# Patient Record
Sex: Female | Born: 1955 | Hispanic: Yes | Marital: Single | Smoking: Never smoker
Health system: Southern US, Community
[De-identification: ages and names within clinical notes are randomized; demographics above are authoritative.]

## PROBLEM LIST (undated history)

## (undated) DIAGNOSIS — E119 Type 2 diabetes mellitus without complications: Secondary | ICD-10-CM

## (undated) DIAGNOSIS — I10 Essential (primary) hypertension: Secondary | ICD-10-CM

## (undated) DIAGNOSIS — J45909 Unspecified asthma, uncomplicated: Secondary | ICD-10-CM

## (undated) HISTORY — PX: NO PAST SURGERIES: SHX2092

---

## 2015-11-21 ENCOUNTER — Encounter: Payer: Self-pay | Admitting: Emergency Medicine

## 2015-11-21 ENCOUNTER — Emergency Department: Payer: Medicare HMO

## 2015-11-21 ENCOUNTER — Emergency Department
Admission: EM | Admit: 2015-11-21 | Discharge: 2015-11-21 | Disposition: A | Discharge status: Home / Self Care | Payer: Medicare HMO | Attending: Student in an Organized Health Care Education/Training Program | Admitting: Student in an Organized Health Care Education/Training Program

## 2015-11-21 DIAGNOSIS — R0602 Shortness of breath: Secondary | ICD-10-CM | POA: Diagnosis present

## 2015-11-21 DIAGNOSIS — I10 Essential (primary) hypertension: Secondary | ICD-10-CM | POA: Insufficient documentation

## 2015-11-21 DIAGNOSIS — R079 Chest pain, unspecified: Secondary | ICD-10-CM

## 2015-11-21 DIAGNOSIS — J209 Acute bronchitis, unspecified: Secondary | ICD-10-CM | POA: Diagnosis not present

## 2015-11-21 DIAGNOSIS — E119 Type 2 diabetes mellitus without complications: Secondary | ICD-10-CM | POA: Insufficient documentation

## 2015-11-21 HISTORY — DX: Essential (primary) hypertension: I10

## 2015-11-21 HISTORY — DX: Type 2 diabetes mellitus without complications: E11.9

## 2015-11-21 LAB — BASIC METABOLIC PANEL
ANION GAP: 8 (ref 5–15)
BUN: 17 mg/dL (ref 6–20)
CALCIUM: 8.9 mg/dL (ref 8.9–10.3)
CO2: 28 mmol/L (ref 22–32)
CREATININE: 0.59 mg/dL (ref 0.44–1.00)
Chloride: 103 mmol/L (ref 101–111)
GFR calc Af Amer: >60 mL/min (ref >60)
GLUCOSE: 215 mg/dL — AB (ref 65–99)
Potassium: 4 mmol/L (ref 3.5–5.1)
Sodium: 139 mmol/L (ref 135–145)

## 2015-11-21 LAB — FIBRIN DERIVATIVES D-DIMER (ARMC ONLY): FIBRIN DERIVATIVES D-DIMER (ARMC): 565 — AB (ref 0–499)

## 2015-11-21 LAB — CBC
HCT: 39.2 % (ref 35.0–47.0)
HEMOGLOBIN: 13.6 g/dL (ref 12.0–16.0)
MCH: 30.8 pg (ref 26.0–34.0)
MCHC: 34.6 g/dL (ref 32.0–36.0)
MCV: 89 fL (ref 80.0–100.0)
PLATELETS: 159 10*3/uL (ref 150–440)
RBC: 4.4 MIL/uL (ref 3.80–5.20)
RDW: 13.3 % (ref 11.5–14.5)
WBC: 6.9 10*3/uL (ref 3.6–11.0)

## 2015-11-21 LAB — TROPONIN I: Troponin I: <0.03 ng/mL (ref <0.03)

## 2015-11-21 LAB — BRAIN NATRIURETIC PEPTIDE: B Natriuretic Peptide: 91 pg/mL (ref 0.0–100.0)

## 2015-11-21 MED ORDER — PREDNISONE 20 MG PO TABS: 60.0000 mg | ORAL_TABLET | Freq: Every day | ORAL | 0 refills | Status: AC

## 2015-11-21 MED ORDER — IOPAMIDOL (ISOVUE-370) INJECTION 76%
75.0000 mL | Freq: Once | INTRAVENOUS | Status: AC | PRN
  Administered 2015-11-21: 75 mL via INTRAVENOUS

## 2015-11-21 MED ORDER — ALBUTEROL SULFATE HFA 108 (90 BASE) MCG/ACT IN AERS: 2.0000 | INHALATION_SPRAY | Freq: Four times a day (QID) | RESPIRATORY_TRACT | 2 refills | Status: DC | PRN

## 2015-11-21 MED ORDER — IPRATROPIUM-ALBUTEROL 0.5-2.5 (3) MG/3ML IN SOLN
3.0000 mL | Freq: Once | RESPIRATORY_TRACT | Status: AC
  Administered 2015-11-21: 3 mL via RESPIRATORY_TRACT
  Filled 2015-11-21: qty 3

## 2015-11-21 MED ORDER — ALBUTEROL SULFATE (2.5 MG/3ML) 0.083% IN NEBU
5.0000 mg | INHALATION_SOLUTION | Freq: Once | RESPIRATORY_TRACT | Status: AC
  Administered 2015-11-21: 5 mg via RESPIRATORY_TRACT
  Filled 2015-11-21: qty 6

## 2015-11-21 MED ORDER — AZITHROMYCIN 500 MG PO TABS
500.0000 mg | ORAL_TABLET | Freq: Once | ORAL | Status: AC
  Administered 2015-11-21: 500 mg via ORAL
  Filled 2015-11-21: qty 1

## 2015-11-21 MED ORDER — AZITHROMYCIN 250 MG PO TABS: ORAL_TABLET | ORAL | 0 refills | Status: DC

## 2015-11-21 MED ORDER — PREDNISONE 20 MG PO TABS
60.0000 mg | ORAL_TABLET | Freq: Once | ORAL | Status: AC
  Administered 2015-11-21: 60 mg via ORAL
  Filled 2015-11-21: qty 3

## 2015-11-21 MED ORDER — LORAZEPAM 1 MG PO TABS
1.0000 mg | ORAL_TABLET | Freq: Once | ORAL | Status: AC
  Administered 2015-11-21: 1 mg via ORAL
  Filled 2015-11-21: qty 1

## 2015-11-21 NOTE — ED Provider Notes (Signed)
[facility] Emergency Department Provider Note    None    (approximate)  I have reviewed the triage vital signs and the nursing notes.   HISTORY  Chief Complaint Chest Pain and Shortness of Breath    HPI Rhonda Wright is a 60 y.o. female  history of hypertension and diabetes presents with 2 weeks of present worsening shortness of breath. Patient states she is also having intermittent episodes of chest discomfort. States that she's got a history of chronic orthopnea and was intimately have lower extremity swelling. States that she was going to let her grandchild today and had sudden onset shortness of breath and chest discomfort. She denies any active chest pain right now. There is no diaphoresis or radiation of pain to the back or to the shoulders. She denies any fevers. Denies any productive cough.  Denies any previous history of coronary disease. States that while sitting out in the lobby she smelled some perfume which made her shortness of breath acutely worse. Denies any personal history of asthma or COPD. She is a nonsmoker.   Past Medical History:  Diagnosis Date  . Diabetes mellitus without complication (HCC)   . Hypertension     There are no active problems to display for this patient.   PMH:  No known history of asthma or copd  Prior to Admission medications   Medication Sig Start Date End Date Taking? Authorizing Provider  albuterol (PROVENTIL HFA;VENTOLIN HFA) 108 (90 Base) MCG/ACT inhaler Inhale 2 puffs into the lungs every 6 (six) hours as needed for wheezing or shortness of breath. 11/21/15   Willy EddyPatrick Lastacia Solum, MD  azithromycin (ZITHROMAX) 250 MG tablet Take one tab daily until completed. 11/21/15   Willy EddyPatrick Ellesse Antenucci, MD  predniSONE (DELTASONE) 20 MG tablet Take 3 tablets (60 mg total) by mouth daily. 11/21/15 11/25/15  Willy EddyPatrick Salim Forero, MD    Allergies Amoxicillin  No family history on file.  Social History Social History  Substance  Use Topics  . Smoking status: Never Smoker  . Smokeless tobacco: Never Used  . Alcohol use No    Review of Systems Patient denies headaches, rhinorrhea, blurry vision, numbness, shortness of breath, chest pain, edema, cough, abdominal pain, nausea, vomiting, diarrhea, dysuria, fevers, rashes or hallucinations unless otherwise stated above in HPI. ____________________________________________   PHYSICAL EXAM:  VITAL SIGNS: Vitals:   11/21/15 1022  BP: (!) 181/89  Pulse: 67  Resp: 20  Temp: 98 F (36.7 C)    Constitutional: Alert and oriented. Well appearing and in no acute distress. Eyes: Conjunctivae are normal. PERRL. EOMI. Head: Atraumatic. Nose: No congestion/rhinnorhea. Mouth/Throat: Mucous membranes are moist.  Oropharynx non-erythematous. Neck: No stridor. Painless ROM. No cervical spine tenderness to palpation Hematological/Lymphatic/Immunilogical: No cervical lymphadenopathy. Cardiovascular: Normal rate, regular rhythm. Grossly normal heart sounds.  Good peripheral circulation. Respiratory: Normal respiratory effort.  No retractions. Lungs with diminshed breathsounds bilaterally, no crackles, no respiratory distress Gastrointestinal: Soft and nontender. No distention. No abdominal bruits. No CVA tenderness. Genitourinary:  Musculoskeletal: No lower extremity tenderness nor edema.  No joint effusions. Neurologic:  Normal speech and language. No gross focal neurologic deficits are appreciated. No gait instability. Skin:  Skin is warm, dry and intact. No rash noted. Psychiatric: Mood and affect are normal. Speech and behavior are normal.  ____________________________________________   LABS (all labs ordered are listed, but only abnormal results are displayed)  Results for orders placed or performed during the hospital encounter of 11/21/15 (from the past 24 hour(s))  Basic metabolic  panel     Status: Abnormal   Collection Time: 11/21/15 10:26 AM  Result Value Ref  Range   Sodium 139 135 - 145 mmol/L   Potassium 4.0 3.5 - 5.1 mmol/L   Chloride 103 101 - 111 mmol/L   CO2 28 22 - 32 mmol/L   Glucose, Bld 215 (H) 65 - 99 mg/dL   BUN 17 6 - 20 mg/dL   Creatinine, Ser 1.61 0.44 - 1.00 mg/dL   Calcium 8.9 8.9 - 09.6 mg/dL   GFR calc non Af Amer >60 >60 mL/min   GFR calc Af Amer >60 >60 mL/min   Anion gap 8 5 - 15  CBC     Status: None   Collection Time: 11/21/15 10:26 AM  Result Value Ref Range   WBC 6.9 3.6 - 11.0 K/uL   RBC 4.40 3.80 - 5.20 MIL/uL   Hemoglobin 13.6 12.0 - 16.0 g/dL   HCT 04.5 40.9 - 81.1 %   MCV 89.0 80.0 - 100.0 fL   MCH 30.8 26.0 - 34.0 pg   MCHC 34.6 32.0 - 36.0 g/dL   RDW 91.4 78.2 - 95.6 %   Platelets 159 150 - 440 K/uL  Troponin I     Status: None   Collection Time: 11/21/15 10:26 AM  Result Value Ref Range   Troponin I <0.03 <0.03 ng/mL  Fibrin derivatives D-Dimer ([facility] only)     Status: Abnormal   Collection Time: 11/21/15 10:26 AM  Result Value Ref Range   Fibrin derivatives D-dimer ([facility]) 565 (H) 0 - 499  Brain natriuretic peptide     Status: None   Collection Time: 11/21/15 10:26 AM  Result Value Ref Range   B Natriuretic Peptide 91.0 0.0 - 100.0 pg/mL   ____________________________________________  EKG My review and personal interpretation at Time: 10:15   Indication: sob  Rate: 65  Rhythm: sinus Axis: normal Other: non spcific st changes, no acute ischemia ____________________________________________  RADIOLOGY  I personally reviewed all radiographic images ordered to evaluate for the above acute complaints and reviewed radiology reports and findings.  These findings were personally discussed with the patient.  Please see medical record for radiology report.  ____________________________________________   PROCEDURES  Procedure(s) performed: none    Critical Care performed: no ____________________________________________   INITIAL IMPRESSION / ASSESSMENT AND PLAN / ED COURSE  Pertinent  labs & imaging results that were available during my care of the patient were reviewed by me and considered in my medical decision making (see chart for details).  DDX: Asthma, copd, CHF, pna, ptx, malignancy, Pe, anemia  Ily Sammons is a 60 y.o. who presents to the ED with 2 weeks of worsening shortness of breath and intermittent chest pain. EKG shows no evidence of acute ischemia. She has no hypoxia. No previous history of COPD. Her risk factors for CAD include hypertension and diabetes. She has no known coronary artery disease. Description of ACS is fairly atypical described very brief episodes of chest pain lasting less than 2-3 minutes. It seems that shortness of breath is her primary concern. Neck likely consistent with CHF. Patient is low-risk [place] therefore we'll further risk stratify for PE with a d-dimer. With this complaint of shortness of breath and diminished breath sounds well also give trial of albuterol nebulizers to see if she has any improvement in his underlying component of bronchitis or obstructive pathology.  The patient will be placed on continuous pulse oximetry and telemetry for monitoring.  Laboratory evaluation will be  sent to evaluate for the above complaints.     Clinical Course  Comment By Time  Patient reports significant improvement and discomfort after DuoNeb. On repeat lung exam patient does have improved movement and finished that were wheezes that were not fully appreciated on previous exam. CT imaging is unremarkable. Patient denies any chest pain or pressure at this time. Currently awaiting on repeat troponin to further risk stratify. clinically this is less consistent with ACS based on history and EKG and troponin without any evidence of acute ischemia with symptoms ongoing for over 2 weeks. Willy Eddy, MD 10/27 [phone]  Patient reassessed remains he medically stable. Now with [place] if a more significant improvement in breath sounds. Patient still feels much  better. Evette Cristal describes issues with getting establish primary care due to frequent travel [place] and [place].  Have provided multiple resources and referrals to primary care service in both cities.  The patient was able to ambulate with a steady gait and with no hypoxia or respiratory distress. Patient remains without acute hypoxia and as her symptoms have remarkably improved without any persistent chest pain or shortness of breath I do feel that she's currently stable for outpatient management. Patient is able to obtain medications on her own and demonstrates ability and understanding of signs and symptoms for which she should return to the ER. Willy Eddy, MD 10/27 1548     ____________________________________________   FINAL CLINICAL IMPRESSION(S) / ED DIAGNOSES  Final diagnoses:  Acute bronchitis, unspecified organism  Chest pain, unspecified type      NEW MEDICATIONS STARTED DURING THIS VISIT:  New Prescriptions   ALBUTEROL (PROVENTIL HFA;VENTOLIN HFA) 108 (90 BASE) MCG/ACT INHALER    Inhale 2 puffs into the lungs every 6 (six) hours as needed for wheezing or shortness of breath.   AZITHROMYCIN (ZITHROMAX) 250 MG TABLET    Take one tab daily until completed.   PREDNISONE (DELTASONE) 20 MG TABLET    Take 3 tablets (60 mg total) by mouth daily.     Note:  This document was prepared using Dragon voice recognition software and may include unintentional dictation errors.    Willy Eddy, MD 11/21/15 [phone]

## 2015-11-21 NOTE — ED Triage Notes (Signed)
Pt presents with chest pain and shortness of breath for over a week.

## 2015-11-21 NOTE — ED Notes (Signed)
This RN ambulated pt, pt maintained adequate oxygen sats, reports some increased work of breathing

## 2016-02-09 ENCOUNTER — Emergency Department: Payer: Medicare HMO

## 2016-02-09 ENCOUNTER — Inpatient Hospital Stay
Admission: EM | Admit: 2016-02-09 | Discharge: 2016-02-12 | DRG: 203 | Disposition: A | Discharge status: Home / Self Care | Payer: Medicare HMO | Attending: Internal Medicine | Admitting: Internal Medicine

## 2016-02-09 DIAGNOSIS — J45901 Unspecified asthma with (acute) exacerbation: Principal | ICD-10-CM | POA: Diagnosis present

## 2016-02-09 DIAGNOSIS — J09X2 Influenza due to identified novel influenza A virus with other respiratory manifestations: Secondary | ICD-10-CM | POA: Diagnosis present

## 2016-02-09 DIAGNOSIS — R0602 Shortness of breath: Secondary | ICD-10-CM | POA: Diagnosis not present

## 2016-02-09 DIAGNOSIS — Z794 Long term (current) use of insulin: Secondary | ICD-10-CM

## 2016-02-09 DIAGNOSIS — R262 Difficulty in walking, not elsewhere classified: Secondary | ICD-10-CM

## 2016-02-09 DIAGNOSIS — Z833 Family history of diabetes mellitus: Secondary | ICD-10-CM | POA: Diagnosis not present

## 2016-02-09 DIAGNOSIS — R0902 Hypoxemia: Secondary | ICD-10-CM | POA: Diagnosis present

## 2016-02-09 DIAGNOSIS — Z23 Encounter for immunization: Secondary | ICD-10-CM

## 2016-02-09 DIAGNOSIS — J111 Influenza due to unidentified influenza virus with other respiratory manifestations: Secondary | ICD-10-CM | POA: Diagnosis present

## 2016-02-09 DIAGNOSIS — Z880 Allergy status to penicillin: Secondary | ICD-10-CM

## 2016-02-09 DIAGNOSIS — I10 Essential (primary) hypertension: Secondary | ICD-10-CM | POA: Diagnosis present

## 2016-02-09 DIAGNOSIS — Z8249 Family history of ischemic heart disease and other diseases of the circulatory system: Secondary | ICD-10-CM | POA: Diagnosis not present

## 2016-02-09 DIAGNOSIS — E119 Type 2 diabetes mellitus without complications: Secondary | ICD-10-CM | POA: Diagnosis present

## 2016-02-09 DIAGNOSIS — E118 Type 2 diabetes mellitus with unspecified complications: Secondary | ICD-10-CM

## 2016-02-09 DIAGNOSIS — J101 Influenza due to other identified influenza virus with other respiratory manifestations: Secondary | ICD-10-CM

## 2016-02-09 HISTORY — DX: Unspecified asthma, uncomplicated: J45.909

## 2016-02-09 LAB — CBC
HEMATOCRIT: 40 % (ref 35.0–47.0)
HEMOGLOBIN: 13.9 g/dL (ref 12.0–16.0)
MCH: 30.8 pg (ref 26.0–34.0)
MCHC: 34.7 g/dL (ref 32.0–36.0)
MCV: 88.8 fL (ref 80.0–100.0)
Platelets: 153 10*3/uL (ref 150–440)
RBC: 4.51 MIL/uL (ref 3.80–5.20)
RDW: 13.4 % (ref 11.5–14.5)
WBC: 8.9 10*3/uL (ref 3.6–11.0)

## 2016-02-09 LAB — INFLUENZA PANEL BY PCR (TYPE A & B)
INFLAPCR: POSITIVE — AB
Influenza B By PCR: NEGATIVE

## 2016-02-09 LAB — BASIC METABOLIC PANEL
ANION GAP: 9 (ref 5–15)
BUN: 14 mg/dL (ref 6–20)
CO2: 27 mmol/L (ref 22–32)
Calcium: 8.9 mg/dL (ref 8.9–10.3)
Chloride: 95 mmol/L — ABNORMAL LOW (ref 101–111)
Creatinine, Ser: 0.6 mg/dL (ref 0.44–1.00)
GFR calc Af Amer: >60 mL/min (ref >60)
GFR calc non Af Amer: >60 mL/min (ref >60)
GLUCOSE: 242 mg/dL — AB (ref 65–99)
POTASSIUM: 3.9 mmol/L (ref 3.5–5.1)
Sodium: 131 mmol/L — ABNORMAL LOW (ref 135–145)

## 2016-02-09 LAB — TROPONIN I: Troponin I: 0.03 ng/mL (ref <0.03)

## 2016-02-09 MED ORDER — IPRATROPIUM-ALBUTEROL 0.5-2.5 (3) MG/3ML IN SOLN
6.0000 mL | Freq: Four times a day (QID) | RESPIRATORY_TRACT | Status: DC
  Administered 2016-02-09: 6 mL via RESPIRATORY_TRACT

## 2016-02-09 MED ORDER — HYDRALAZINE HCL 20 MG/ML IJ SOLN
INTRAMUSCULAR | Status: AC
  Filled 2016-02-09: qty 1

## 2016-02-09 MED ORDER — IPRATROPIUM-ALBUTEROL 0.5-2.5 (3) MG/3ML IN SOLN
RESPIRATORY_TRACT | Status: AC
  Administered 2016-02-09: 6 mL via RESPIRATORY_TRACT
  Filled 2016-02-09: qty 6

## 2016-02-09 MED ORDER — OSELTAMIVIR PHOSPHATE 75 MG PO CAPS
75.0000 mg | ORAL_CAPSULE | Freq: Once | ORAL | Status: AC
  Administered 2016-02-09: 75 mg via ORAL
  Filled 2016-02-09: qty 1

## 2016-02-09 MED ORDER — HYDRALAZINE HCL 20 MG/ML IJ SOLN
10.0000 mg | Freq: Once | INTRAMUSCULAR | Status: AC
  Administered 2016-02-09: 10 mg via INTRAVENOUS

## 2016-02-09 NOTE — ED Notes (Signed)
Pt placed on 2L O2 via [place] d/t Triage oxygen sats in 87-90% range on RA. Pt sats improved to 94%; pt placed in sub-wait at this time.

## 2016-02-09 NOTE — ED Notes (Signed)
Melody Lawerance BachBurns- daughter in [phone]

## 2016-02-09 NOTE — ED Notes (Signed)
Pt states medial CP, non radiating off and off x 2-3 days with SOB as well. States she was told she has asthma, was given inhaler and states it hasn't been working today. Pt on 2 L nasal cannula from triage, does not wear oxygen normally. States diarrhea and fever at home (does not have thermometer to take temperature at home). Pt sitting up, alert and oriented.

## 2016-02-09 NOTE — ED Notes (Signed)
Dr. Lenard LancePaduchowski turned pt oxygen off. Pt 93% RA but stating she was having difficulty breathing. Oxygen turned back on by this RN.

## 2016-02-09 NOTE — ED Provider Notes (Signed)
[facility] Emergency Department Provider Note  Time seen: 9:05 PM  I have reviewed the triage vital signs and the nursing notes.   HISTORY  Chief Complaint Shortness of Breath and Chest Pain    HPI Rhonda Wright is a 61 y.o. female with a past medical history of diabetes and hypertension who presents to the emergency department with 2-3 days of shortness of breath generalized weakness fatigue and fever.Patient has a room air saturation of 86% with a fever of 100.5 and a pulse rate of 106. Patient denies any chest pain or abdominal pain but does state nausea with episode of diarrhea today. Has not vomited. States she feels extremely fatigued and weak. Sitting upright in bed with mild distress due to difficulty breathing.  Past Medical History:  Diagnosis Date  . Diabetes mellitus without complication (HCC)   . Hypertension     There are no active problems to display for this patient.   History reviewed. No pertinent surgical history.  Prior to Admission medications   Medication Sig Start Date End Date Taking? Authorizing Provider  albuterol (PROVENTIL HFA;VENTOLIN HFA) 108 (90 Base) MCG/ACT inhaler Inhale 2 puffs into the lungs every 6 (six) hours as needed for wheezing or shortness of breath. 11/21/15   Willy EddyPatrick Robinson, MD  azithromycin (ZITHROMAX) 250 MG tablet Take one tab daily until completed. 11/21/15   Willy EddyPatrick Robinson, MD    Allergies  Allergen Reactions  . Amoxicillin     No family history on file.  Social History Social History  Substance Use Topics  . Smoking status: Never Smoker  . Smokeless tobacco: Never Used  . Alcohol use No    Review of Systems Constitutional: Subjective fever. Cardiovascular: Negative for chest pain. Respiratory: Positive for shortness of breath and positive for cough. Gastrointestinal: Negative for abdominal pain. Positive for nausea. Negative for vomiting. Positive for diarrhea. Genitourinary: Negative  for dysuria. Musculoskeletal: Negative for back pain. Neurological: Negative for headache 10-point ROS otherwise negative.  ____________________________________________   PHYSICAL EXAM:  VITAL SIGNS: ED Triage Vitals  Enc Vitals Group     BP 02/09/16 1931 (!) 171/86     Pulse Rate 02/09/16 1931 (!) 106     Resp 02/09/16 1931 20     Temp 02/09/16 1931 (!) 100.5 F (38.1 C)     Temp Source 02/09/16 1931 Oral     SpO2 02/09/16 1931 (!) 86 %     Weight 02/09/16 1931 300 lb (136.1 kg)     Height 02/09/16 1931 (!) 5" (0.127 m)     Head Circumference --      Peak Flow --      Pain Score 02/09/16 1932 0     Pain Loc --      Pain Edu? --      Excl. in GC? --     Constitutional: Alert and oriented. Sitting upright in bed, mild respiratory distress. Eyes: Normal exam ENT   Head: Normocephalic and atraumatic.   Mouth/Throat: Mucous membranes are moist. Cardiovascular: Normal rate, regular rhythm around 100 bpm. No murmur Respiratory: Normal respiratory effort without tachypnea nor retractions. Breath sounds are clear  Gastrointestinal: Soft and nontender. No distention.   Musculoskeletal: Nontender with normal range of motion in all extremities.  Neurologic:  Normal speech and language. No gross focal neurologic deficits  Skin:  Skin is warm, dry and intact.  Psychiatric: Mood and affect are normal.  ____________________________________________    EKG  EKG reviewed and interpreted by myself shows  normal sinus rhythm at 96 bpm. Narrow QRS, slight left axis deviation, large the normal intervals with no concerning ST changes.  ____________________________________________    RADIOLOGY  Chest x-ray shows prominent vascular markings and mild cardiomegaly and/or reactive airway disease.  ____________________________________________   INITIAL IMPRESSION / ASSESSMENT AND PLAN / ED COURSE  Pertinent labs & imaging results that were available during my care of the patient  were reviewed by me and considered in my medical decision making (see chart for details).  Patient presents the emergency department with difficulty breathing generalized weakness fatigue and fever. Patient's labs have resulted positive for influenza A. Patient is hypoxic to 86% on room air. Denies any history of asthma COPD or CHF. Chest x-ray shows vascular markings which could be consistent pulmonary edema reactive airway disease. We will treat with DuoNeb's and continue to closely monitor in the emergency department.  Patient remains hypoxic off oxygen. Remains have shortness of breath. We'll admit the patient to the hospital. Start Tamiflu. ____________________________________________   FINAL CLINICAL IMPRESSION(S) / ED DIAGNOSES  Dyspnea Hypoxia Influenza A    Minna Antis, MD 02/09/16 2208

## 2016-02-09 NOTE — ED Notes (Signed)
Dr. Willis at bedside  

## 2016-02-09 NOTE — H&P (Signed)
[facility] - [place] at [facility]   PATIENT NAME: Rhonda Wright    MR#:  [id number]  DATE OF BIRTH:  09-11-1955  DATE OF ADMISSION:  02/09/2016  PRIMARY CARE PHYSICIAN: No PCP Per Patient   REQUESTING/REFERRING PHYSICIAN: Lenard Lance, MD  CHIEF COMPLAINT:   Chief Complaint  Patient presents with  . Shortness of Breath  . Chest Pain    HISTORY OF PRESENT ILLNESS:  Rhonda Wright  is a 61 y.o. female who presents with Increased shortness of breath, malaise, fever for the past 2 days. Patient states she was diagnosed not very long ago with asthma, and has had difficulty breathing since that time. However over the past 2 days this grew significantly worse, and she developed fevers with increased cough and fatigue. Here in the ED she was found to have O2 sats in the mid 80s on room air, which corrected well with oxygen via nasal cannula. She was found to be fluid positive. Chest x-ray did not seem to indicate pneumonia and a right blood cell count was within normal limits, as well as the rest of her labs. Tamiflu was started. Given her hypoxia in the setting of confirm influenza, hospitalists were called for admission  PAST MEDICAL HISTORY:   Past Medical History:  Diagnosis Date  . Diabetes mellitus without complication (HCC)   . Hypertension     PAST SURGICAL HISTORY:   Past Surgical History:  Procedure Laterality Date  . NO PAST SURGERIES      SOCIAL HISTORY:   Social History  Substance Use Topics  . Smoking status: Never Smoker  . Smokeless tobacco: Never Used  . Alcohol use No    FAMILY HISTORY:   Family History  Problem Relation Age of Onset  . Diabetes Other   . Hypertension Other   . Cancer Other     DRUG ALLERGIES:   Allergies  Allergen Reactions  . Amoxicillin Swelling and Other (See Comments)    Has patient had a PCN reaction causing immediate rash, facial/tongue/throat swelling, SOB or lightheadedness with hypotension:  Yes Has patient had a PCN reaction causing severe rash involving mucus membranes or skin necrosis: No Has patient had a PCN reaction that required hospitalization No Has patient had a PCN reaction occurring within the last 10 years: Yes If all of the above answers are "NO", then may proceed with Cephalosporin use.     MEDICATIONS AT HOME:   Prior to Admission medications   Medication Sig Start Date End Date Taking? Authorizing Provider  albuterol (PROVENTIL HFA;VENTOLIN HFA) 108 (90 Base) MCG/ACT inhaler Inhale 2 puffs into the lungs every 6 (six) hours as needed for wheezing or shortness of breath. 11/21/15  Yes Willy Eddy, MD  albuterol (PROVENTIL) (2.5 MG/3ML) 0.083% nebulizer solution Take 2.5 mg by nebulization every 6 (six) hours as needed for wheezing or shortness of breath.   Yes Historical Provider, MD  ibuprofen (ADVIL,MOTRIN) 200 MG tablet Take 1,000-1,200 mg by mouth every 6 (six) hours as needed for fever or mild pain.   Yes Historical Provider, MD    REVIEW OF SYSTEMS:  Review of Systems  Constitutional: Positive for fever and malaise/fatigue. Negative for chills and weight loss.  HENT: Negative for ear pain, hearing loss and tinnitus.   Eyes: Negative for blurred vision, double vision, pain and redness.  Respiratory: Positive for cough and shortness of breath. Negative for hemoptysis.   Cardiovascular: Negative for chest pain, palpitations, orthopnea and leg swelling.  Gastrointestinal: Negative for  abdominal pain, constipation, diarrhea, nausea and vomiting.  Genitourinary: Negative for dysuria, frequency and hematuria.  Musculoskeletal: Negative for back pain, joint pain and neck pain.  Skin:       No acne, rash, or lesions  Neurological: Negative for dizziness, tremors, focal weakness and weakness.  Endo/Heme/Allergies: Negative for polydipsia. Does not bruise/bleed easily.  Psychiatric/Behavioral: Negative for depression. The patient is not nervous/anxious and  does not have insomnia.      VITAL SIGNS:   Vitals:   02/09/16 2130 02/09/16 2140 02/09/16 2150 02/09/16 2200  BP: (!) 209/99 (!) 181/91 (!) 160/92 (!) 171/95  Pulse:  91 90 86  Resp: (!) 25 (!) 30 (!) 31 (!) 28  Temp:      TempSrc:      SpO2:  93% 92% 95%  Weight:      Height:       Wt Readings from Last 3 Encounters:  02/09/16 136.1 kg (300 lb)  11/21/15 111.1 kg (245 lb)    PHYSICAL EXAMINATION:  Physical Exam  Vitals reviewed. Constitutional: She is oriented to person, place, and time. She appears well-developed and well-nourished. No distress.  HENT:  Head: Normocephalic and atraumatic.  Mouth/Throat: Oropharynx is clear and moist.  Eyes: Conjunctivae and EOM are normal. Pupils are equal, round, and reactive to light. No scleral icterus.  Neck: Normal range of motion. Neck supple. No JVD present. No thyromegaly present.  Cardiovascular: Normal rate, regular rhythm and intact distal pulses.  Exam reveals no gallop and no friction rub.   No murmur heard. Respiratory: She is in respiratory distress. She has no wheezes. She has no rales.  Diffuse bilateral coarse breath sounds, worst on expiration  GI: Soft. Bowel sounds are normal. She exhibits no distension. There is no tenderness.  Musculoskeletal: Normal range of motion. She exhibits no edema.  No arthritis, no gout  Lymphadenopathy:    She has no cervical adenopathy.  Neurological: She is alert and oriented to person, place, and time. No cranial nerve deficit.  No dysarthria, no aphasia  Skin: Skin is warm and dry. No rash noted. No erythema.  Psychiatric: She has a normal mood and affect. Her behavior is normal. Judgment and thought content normal.    LABORATORY PANEL:   CBC  Recent Labs Lab 02/09/16 1937  WBC 8.9  HGB 13.9  HCT 40.0  PLT 153   ------------------------------------------------------------------------------------------------------------------  Chemistries   Recent Labs Lab  02/09/16 1937  NA 131*  K 3.9  CL 95*  CO2 27  GLUCOSE 242*  BUN 14  CREATININE 0.60  CALCIUM 8.9   ------------------------------------------------------------------------------------------------------------------  Cardiac Enzymes  Recent Labs Lab 02/09/16 1937  TROPONINI 0.03*   ------------------------------------------------------------------------------------------------------------------  RADIOLOGY:  Dg Chest 2 View  Result Date: 02/09/2016 CLINICAL DATA:  Shortness of breath, sternal chest pressure. History of diabetes and hypertension. EXAM: CHEST  2 VIEW COMPARISON:  Chest radiograph September 21, 2015 FINDINGS: Cardiac silhouette is mildly enlarged. Prominent bronchovascular markings without pleural effusion. Bibasilar strandy densities. No pneumothorax. Soft tissue planes and included osseous structures are unchanged. Mild degenerative change of the thoracic spine. IMPRESSION: Mild cardiomegaly. Prominent bronchovascular markings can be seen with pulmonary edema and/or bronchitis/reactive airway disease. Bibasilar atelectasis. Electronically Signed   By: Awilda Metro M.D.   On: 02/09/2016 20:24    EKG:   Orders placed or performed during the hospital encounter of 02/09/16  . ED EKG within 10 minutes  . ED EKG within 10 minutes    IMPRESSION AND PLAN:  Principal Problem:   Hypoxia - this is due to her influenza and underlying asthma, see treatment for these below, oxygen level improved significantly with O2 via nasal cannula, we will continue supplemental oxygen for now Active Problems:   Influenza - Tamiflu started this patient's symptoms likely developed the last 48 hours, continue Tamiflu for full course   Asthma exacerbation - when necessary duo nebs, antitussives, we will also give her a prednisone burst   HTN (hypertension) - stable, continue home meds   Diabetes (HCC) - sliding scale insulin with corresponding glucose checks  All the records are reviewed  and case discussed with ED provider. Management plans discussed with the patient and/or family.  DVT PROPHYLAXIS: SubQ lovenox  GI PROPHYLAXIS: None  ADMISSION STATUS: Inpatient  CODE STATUS: Full Code Status History    This patient does not have a recorded code status. Please follow your organizational policy for patients in this situation.      TOTAL TIME TAKING CARE OF THIS PATIENT: 45 minutes.    Tresean Mattix FIELDING 02/09/2016, 10:39 PM  [facility]  Office  [phone]  CC: Primary care physician; No PCP Per Patient

## 2016-02-09 NOTE — ED Triage Notes (Addendum)
Pt presents to ED with c/o centralized, non-radiating chest pain and shortness of breath x2 days. Pt reports h/x of asthma, denies cardiac history. Pt reports dyspnea at rest, with congested non-productive cough. Pt reports (+) fever (didn't take actual temp "I just feel the hotness"), (+) nausea (-) emesis (+) diarrhea. Pt reports taking Mucinex last night but nothing today. Pt reports using Nebulizer at home without relief.

## 2016-02-09 NOTE — ED Notes (Signed)
Pt states hx HTN, does not take any medicine for BP and denies taking anything in a long time for BP.

## 2016-02-10 LAB — GLUCOSE, CAPILLARY
GLUCOSE-CAPILLARY: 256 mg/dL — AB (ref 65–99)
GLUCOSE-CAPILLARY: 194 mg/dL — AB (ref 65–99)
Glucose-Capillary: 333 mg/dL — ABNORMAL HIGH (ref 65–99)
Glucose-Capillary: 223 mg/dL — ABNORMAL HIGH (ref 65–99)

## 2016-02-10 LAB — BASIC METABOLIC PANEL
Anion gap: 8 (ref 5–15)
BUN: 14 mg/dL (ref 6–20)
CO2: 27 mmol/L (ref 22–32)
Calcium: 8.5 mg/dL — ABNORMAL LOW (ref 8.9–10.3)
Chloride: 99 mmol/L — ABNORMAL LOW (ref 101–111)
Creatinine, Ser: 0.69 mg/dL (ref 0.44–1.00)
Glucose, Bld: 324 mg/dL — ABNORMAL HIGH (ref 65–99)
POTASSIUM: 4.2 mmol/L (ref 3.5–5.1)
SODIUM: 134 mmol/L — AB (ref 135–145)

## 2016-02-10 LAB — CBC
HEMATOCRIT: 39.3 % (ref 35.0–47.0)
Hemoglobin: 13.7 g/dL (ref 12.0–16.0)
MCH: 31.1 pg (ref 26.0–34.0)
MCHC: 34.7 g/dL (ref 32.0–36.0)
MCV: 89.5 fL (ref 80.0–100.0)
Platelets: 152 10*3/uL (ref 150–440)
RBC: 4.39 MIL/uL (ref 3.80–5.20)
RDW: 13.3 % (ref 11.5–14.5)
WBC: 8.7 10*3/uL (ref 3.6–11.0)

## 2016-02-10 LAB — TROPONIN I
TROPONIN I: 0.03 ng/mL — AB (ref <0.03)
Troponin I: 0.04 ng/mL (ref <0.03)
Troponin I: 0.04 ng/mL (ref <0.03)

## 2016-02-10 MED ORDER — GUAIFENESIN-DM 100-10 MG/5ML PO SYRP: 5.0000 mL | ORAL_SOLUTION | ORAL | Status: DC | PRN

## 2016-02-10 MED ORDER — INSULIN ASPART 100 UNIT/ML ~~LOC~~ SOLN
0.0000 [IU] | Freq: Every day | SUBCUTANEOUS | Status: DC
  Administered 2016-02-10: 2 [IU] via SUBCUTANEOUS
  Filled 2016-02-10: qty 2

## 2016-02-10 MED ORDER — BENZONATATE 100 MG PO CAPS: 200.0000 mg | ORAL_CAPSULE | Freq: Three times a day (TID) | ORAL | Status: DC | PRN

## 2016-02-10 MED ORDER — ENOXAPARIN SODIUM 40 MG/0.4ML ~~LOC~~ SOLN: 40.0000 mg | SUBCUTANEOUS | Status: DC

## 2016-02-10 MED ORDER — ENOXAPARIN SODIUM 40 MG/0.4ML ~~LOC~~ SOLN
40.0000 mg | Freq: Two times a day (BID) | SUBCUTANEOUS | Status: DC
  Administered 2016-02-10 – 2016-02-12 (×5): 40 mg via SUBCUTANEOUS
  Filled 2016-02-10 (×5): qty 0.4

## 2016-02-10 MED ORDER — INSULIN ASPART 100 UNIT/ML ~~LOC~~ SOLN
0.0000 [IU] | Freq: Three times a day (TID) | SUBCUTANEOUS | Status: DC
  Administered 2016-02-10: 2 [IU] via SUBCUTANEOUS
  Administered 2016-02-10: 7 [IU] via SUBCUTANEOUS
  Administered 2016-02-10: 5 [IU] via SUBCUTANEOUS
  Administered 2016-02-11: 1 [IU] via SUBCUTANEOUS
  Administered 2016-02-11: 7 [IU] via SUBCUTANEOUS
  Administered 2016-02-11: 3 [IU] via SUBCUTANEOUS
  Administered 2016-02-12 (×2): 5 [IU] via SUBCUTANEOUS
  Administered 2016-02-12: 1 [IU] via SUBCUTANEOUS
  Filled 2016-02-10 (×2): qty 5
  Filled 2016-02-10: qty 7
  Filled 2016-02-10: qty 2
  Filled 2016-02-10: qty 5
  Filled 2016-02-10: qty 1
  Filled 2016-02-10: qty 3
  Filled 2016-02-10: qty 7
  Filled 2016-02-10: qty 1

## 2016-02-10 MED ORDER — PNEUMOCOCCAL VAC POLYVALENT 25 MCG/0.5ML IJ INJ
0.5000 mL | INJECTION | INTRAMUSCULAR | Status: AC
  Administered 2016-02-11: 0.5 mL via INTRAMUSCULAR
  Filled 2016-02-10: qty 0.5

## 2016-02-10 MED ORDER — OXYCODONE-ACETAMINOPHEN 5-325 MG PO TABS
1.0000 | ORAL_TABLET | Freq: Four times a day (QID) | ORAL | Status: DC | PRN
  Administered 2016-02-11 (×2): 1 via ORAL
  Filled 2016-02-10 (×2): qty 1

## 2016-02-10 MED ORDER — ACETAMINOPHEN 650 MG RE SUPP: 650.0000 mg | Freq: Four times a day (QID) | RECTAL | Status: DC | PRN

## 2016-02-10 MED ORDER — IPRATROPIUM-ALBUTEROL 0.5-2.5 (3) MG/3ML IN SOLN
3.0000 mL | RESPIRATORY_TRACT | Status: DC | PRN
  Administered 2016-02-10 – 2016-02-12 (×6): 3 mL via RESPIRATORY_TRACT
  Filled 2016-02-10 (×6): qty 3

## 2016-02-10 MED ORDER — INSULIN GLARGINE 100 UNIT/ML ~~LOC~~ SOLN
20.0000 [IU] | Freq: Every day | SUBCUTANEOUS | Status: DC
  Administered 2016-02-10 – 2016-02-11 (×2): 20 [IU] via SUBCUTANEOUS
  Filled 2016-02-10 (×3): qty 0.2

## 2016-02-10 MED ORDER — ONDANSETRON HCL 4 MG/2ML IJ SOLN: 4.0000 mg | Freq: Four times a day (QID) | INTRAMUSCULAR | Status: DC | PRN

## 2016-02-10 MED ORDER — AMLODIPINE BESYLATE 5 MG PO TABS
5.0000 mg | ORAL_TABLET | Freq: Every day | ORAL | Status: DC
  Administered 2016-02-10 – 2016-02-12 (×3): 5 mg via ORAL
  Filled 2016-02-10 (×3): qty 1

## 2016-02-10 MED ORDER — ENOXAPARIN SODIUM 40 MG/0.4ML ~~LOC~~ SOLN
40.0000 mg | Freq: Every day | SUBCUTANEOUS | Status: DC
  Administered 2016-02-10: 40 mg via SUBCUTANEOUS
  Filled 2016-02-10: qty 0.4

## 2016-02-10 MED ORDER — ACETAMINOPHEN 325 MG PO TABS
650.0000 mg | ORAL_TABLET | Freq: Four times a day (QID) | ORAL | Status: DC | PRN
  Administered 2016-02-10 – 2016-02-12 (×5): 650 mg via ORAL
  Filled 2016-02-10 (×5): qty 2

## 2016-02-10 MED ORDER — HEPARIN SODIUM (PORCINE) 5000 UNIT/ML IJ SOLN: 5000.0000 [IU] | Freq: Three times a day (TID) | INTRAMUSCULAR | Status: DC

## 2016-02-10 MED ORDER — OSELTAMIVIR PHOSPHATE 75 MG PO CAPS
75.0000 mg | ORAL_CAPSULE | Freq: Two times a day (BID) | ORAL | Status: DC
  Administered 2016-02-10 – 2016-02-12 (×6): 75 mg via ORAL
  Filled 2016-02-10 (×6): qty 1

## 2016-02-10 MED ORDER — LABETALOL HCL 5 MG/ML IV SOLN
5.0000 mg | INTRAVENOUS | Status: DC | PRN
Start: 2016-02-10 — End: 2016-02-12
  Administered 2016-02-10: 5 mg via INTRAVENOUS
  Filled 2016-02-10: qty 4

## 2016-02-10 MED ORDER — SODIUM CHLORIDE 0.9 % IV SOLN
INTRAVENOUS | Status: AC
  Administered 2016-02-10 (×2): via INTRAVENOUS

## 2016-02-10 MED ORDER — PREDNISONE 20 MG PO TABS
40.0000 mg | ORAL_TABLET | Freq: Every day | ORAL | Status: DC
  Administered 2016-02-10 – 2016-02-12 (×3): 40 mg via ORAL
  Filled 2016-02-10 (×3): qty 2

## 2016-02-10 MED ORDER — ORAL CARE MOUTH RINSE
15.0000 mL | Freq: Two times a day (BID) | OROMUCOSAL | Status: DC
  Administered 2016-02-10 – 2016-02-12 (×5): 15 mL via OROMUCOSAL

## 2016-02-10 MED ORDER — ONDANSETRON HCL 4 MG PO TABS: 4.0000 mg | ORAL_TABLET | Freq: Four times a day (QID) | ORAL | Status: DC | PRN

## 2016-02-10 NOTE — Progress Notes (Signed)
[facility] -  at [facility]   PATIENT NAME: Rhonda Wright    MR#:  [id number]  DATE OF BIRTH:  1955/09/27  SUBJECTIVE:  CHIEF COMPLAINT: Patient is still short of breath and coughing. Feeling tight in her chest   REVIEW OF SYSTEMS:  CONSTITUTIONAL: No fever, fatigue or weakness.  EYES: No blurred or double vision.  EARS, NOSE, AND THROAT: No tinnitus or ear pain.  RESPIRATORY:  Reports some cough and tightness in her chest. Exertional shortness of breath CARDIOVASCULAR: No chest pain, orthopnea, edema.  GASTROINTESTINAL: No nausea, vomiting, diarrhea or abdominal pain.  GENITOURINARY: No dysuria, hematuria.  ENDOCRINE: No polyuria, nocturia,  HEMATOLOGY: No anemia, easy bruising or bleeding SKIN: No rash or lesion. MUSCULOSKELETAL: No joint pain or arthritis.   NEUROLOGIC: No tingling, numbness, weakness.  PSYCHIATRY: No anxiety or depression.   DRUG ALLERGIES:   Allergies  Allergen Reactions  . Amoxicillin Swelling and Other (See Comments)    Has patient had a PCN reaction causing immediate rash, facial/tongue/throat swelling, SOB or lightheadedness with hypotension: Yes Has patient had a PCN reaction causing severe rash involving mucus membranes or skin necrosis: No Has patient had a PCN reaction that required hospitalization No Has patient had a PCN reaction occurring within the last 10 years: Yes If all of the above answers are "NO", then may proceed with Cephalosporin use.     VITALS:  Blood pressure (!) 160/88, pulse 74, temperature 97.4 F (36.3 C), temperature source Axillary, resp. rate 18, height 5' (1.524 m), weight 136.1 kg (300 lb), SpO2 100 %.  PHYSICAL EXAMINATION:  GENERAL:  61 y.o.-year-old patient lying in the bed with no acute distress.  EYES: Pupils equal, round, reactive to light and accommodation. No scleral icterus. Extraocular muscles intact.  HEENT: Head atraumatic, normocephalic. Oropharynx and nasopharynx clear.   NECK:  Supple, no jugular venous distention. No thyroid enlargement, no tenderness.  LUNGS: Diminished  breath sounds bilaterally, no wheezing, rales,rhonchi or crepitation. No use of accessory muscles of respiration.  CARDIOVASCULAR: S1, S2 normal. No murmurs, rubs, or gallops.  ABDOMEN: Soft, nontender, nondistended. Bowel sounds present. No organomegaly or mass.  EXTREMITIES: No pedal edema, cyanosis, or clubbing.  NEUROLOGIC: Cranial nerves II through XII are intact. Muscle strength 5/5 in all extremities. Sensation intact. Gait not checked.  PSYCHIATRIC: The patient is alert and oriented x 3.  SKIN: No obvious rash, lesion, or ulcer.    LABORATORY PANEL:   CBC  Recent Labs Lab 02/10/16 0555  WBC 8.7  HGB 13.7  HCT 39.3  PLT 152   ------------------------------------------------------------------------------------------------------------------  Chemistries   Recent Labs Lab 02/10/16 0555  NA 134*  K 4.2  CL 99*  CO2 27  GLUCOSE 324*  BUN 14  CREATININE 0.69  CALCIUM 8.5*   ------------------------------------------------------------------------------------------------------------------  Cardiac Enzymes  Recent Labs Lab 02/10/16 1138  TROPONINI 0.03*   ------------------------------------------------------------------------------------------------------------------  RADIOLOGY:  Dg Chest 2 View  Result Date: 02/09/2016 CLINICAL DATA:  Shortness of breath, sternal chest pressure. History of diabetes and hypertension. EXAM: CHEST  2 VIEW COMPARISON:  Chest radiograph September 21, 2015 FINDINGS: Cardiac silhouette is mildly enlarged. Prominent bronchovascular markings without pleural effusion. Bibasilar strandy densities. No pneumothorax. Soft tissue planes and included osseous structures are unchanged. Mild degenerative change of the thoracic spine. IMPRESSION: Mild cardiomegaly. Prominent bronchovascular markings can be seen with pulmonary edema and/or  bronchitis/reactive airway disease. Bibasilar atelectasis. Electronically Signed   By: Awilda Metroourtnay  Bloomer M.D.   On: 02/09/2016 20:24  EKG:   Orders placed or performed during the hospital encounter of 02/09/16  . ED EKG within 10 minutes  . ED EKG within 10 minutes    ASSESSMENT AND PLAN:   Acute Hypoxia secondary to asthma exacerbation and influenza Clinically doing okay. Continue oxygen and wean off as tolerated-    Influenza -  Continue Tamiflu. Nebulizer treatments and supportive treatment    Asthma exacerbation -  Patient is feeling tight in her chest. Will provide her IV steroids, nebulizer treatments and symptomatic treatment Incentive spirometry     HTN (hypertension) - stable, continue home meds    Diabetes (HCC) - sliding scale insulin with corresponding glucose checks   Physical therapy consult for deconditioning  All the records are reviewed and case discussed with Care Management/Social Workerr. Management plans discussed with the patient, family and they are in agreement.  CODE STATUS: FC   TOTAL TIME TAKING CARE OF THIS PATIENT: 36 minutes.   POSSIBLE D/C IN 1-2 DAYS, DEPENDING ON CLINICAL CONDITION.  Note: This dictation was prepared with Dragon dictation along with smaller phrase technology. Any transcriptional errors that result from this process are unintentional.   Ramonita Lab M.D on 02/10/2016 at 3:40 PM  Between 7am to 6pm - Pager - [phone] After 6pm go to [email or web address] - password [email or web address] [facility]  [place] [facility]  Office  [phone]  CC: Primary care physician; No PCP Per Patient

## 2016-02-10 NOTE — Clinical Social Work Note (Signed)
CSW consulted due to patient having difficulty obtaining medications. RN CM will assess for this. Please re-consult if CSW needs arise. York SpanielMonica Dandy Lazaro MSW,LCSW [phone]

## 2016-02-10 NOTE — Progress Notes (Signed)
Anticoagulation monitoring(Lovenox):  60yo  F ordered Lovenox 40 mg Q24h  Filed Weights   02/09/16 1931  Weight: 300 lb (136.1 kg)   BMI 58.7   Lab Results  Component Value Date   CREATININE 0.69 02/10/2016   CREATININE 0.60 02/09/2016   CREATININE 0.59 11/21/2015   Estimated Creatinine Clearance: 94 mL/min (by C-G formula based on SCr of 0.69 mg/dL). Hemoglobin & Hematocrit     Component Value Date/Time   HGB 13.7 02/10/2016 0555   HCT 39.3 02/10/2016 0555     Per Protocol for Patient with estCrcl > 30 ml/min and BMI > 40, will transition to Lovenox 40 mg Q12h.      Bari MantisKristin Rodolfo Notaro PharmD Clinical Pharmacist 02/10/2016

## 2016-02-10 NOTE — Progress Notes (Signed)
Called Dr. Sheryle Hailiamond regarding blood pressure and pain medication.  Appropriate orders were placed.  Rhonda Wright, Raziya Aveni N  02/10/2016  2:43 AM

## 2016-02-10 NOTE — Progress Notes (Signed)
Inpatient Diabetes Program Recommendations  AACE/ADA: New Consensus Statement on Inpatient Glycemic Control (2015)  Target Ranges:  Prepandial:   less than 140 mg/dL      Peak postprandial:   less than 180 mg/dL (1-2 hours)      Critically ill patients:  140 - 180 mg/dL   Lab Results  Component Value Date   GLUCAP 256 (H) 02/10/2016    Review of Glycemic Control  Results for Steelman, Neida (MRN [id number]) as of 02/10/2016 12:37  Ref. Range 02/10/2016 08:42 02/10/2016 12:16  Glucose-Capillary Latest Ref Range: 65 - 99 mg/dL 782333 (H) 956256 (H)    Diabetes history: Type 2, A1C pending Outpatient Diabetes medications: None noted  Current orders for Inpatient glycemic control: Novolog 0-9 units tid, Novolog 0-5 units qhs  * steroids  Inpatient Diabetes Program Recommendations:  Consider adding Lantus 20 units qhs (0.15units/kg) while the patient is on steroids and increase her Novolog to moderate correction 0-15 units tid, Novolog 0-5 units qhs  Susette RacerJulie Lakeyia Surber, RN, [place], [place]HA, CDE Diabetes Coordinator Inpatient Diabetes Program  [phone] (Team Pager) [phone] [facility] Office) 02/10/2016 12:41 PM

## 2016-02-11 LAB — GLUCOSE, CAPILLARY
GLUCOSE-CAPILLARY: 145 mg/dL — AB (ref 65–99)
GLUCOSE-CAPILLARY: 247 mg/dL — AB (ref 65–99)
GLUCOSE-CAPILLARY: 185 mg/dL — AB (ref 65–99)
Glucose-Capillary: 341 mg/dL — ABNORMAL HIGH (ref 65–99)

## 2016-02-11 LAB — HEMOGLOBIN A1C
Hgb A1c MFr Bld: 9.9 % — ABNORMAL HIGH (ref 4.8–5.6)
Mean Plasma Glucose: 237 mg/dL

## 2016-02-11 MED ORDER — LIVING WELL WITH DIABETES BOOK
Freq: Once | Status: AC
  Administered 2016-02-11: 14:00:00
  Filled 2016-02-11: qty 1

## 2016-02-11 MED ORDER — GUAIFENESIN-DM 100-10 MG/5ML PO SYRP: 10.0000 mL | ORAL_SOLUTION | ORAL | Status: DC | PRN

## 2016-02-11 NOTE — Plan of Care (Signed)
Problem: Pain Managment: Goal: General experience of comfort will improve Outcome: Not Progressing Pt is reporting pain to abdomen with coughing. Pt has taken PO tylenol but is refusing anything stronger. Pt is also refusing any PRN medication for her cough.

## 2016-02-11 NOTE — Evaluation (Signed)
Physical Therapy Evaluation Patient Details Name: Rhonda Wright MRN: [id number] DOB: 11/15/1955 Today's Date: 02/11/2016   History of Present Illness  Pt is a 61 y.o. female presenting to hospital with SOB x2 days and centralized non-radiating chest pain, weakness, fatigue, and fever.  Pt admitted with acute hypoxia secondary to asthma exacerbation and influenza A.  Clinical Impression  Pt demonstrates steady independent ambulation without AD.  Pt scored 28/28 on Tinetti balance assessment indicating pt is at low risk for falls.  Trialed pt on room air (per discussion with nursing) and pt 94% at rest on room air and decreased to 88% on room air post ambulation with mild SOB noted (pt placed back on 2 L O2 and oxygen sats quickly increased to 96%).  Pt would benefit from ambulating with staff with monitoring of oxygen saturations.  No skilled acute PT needs identified; will complete current PT order and discharge pt from PT in house.  Please re-consult PT if pt's status changes and acute PT needs are identified.    Follow Up Recommendations No PT follow up    Equipment Recommendations  None recommended by PT    Recommendations for Other Services       Precautions / Restrictions Precautions Precautions:  (Moderate fall risk (score of 8)) Restrictions Weight Bearing Restrictions: No      Mobility  Bed Mobility               General bed mobility comments: Deferred d/t pt sitting on edge of bed upon arrival (pt reports no difficulty getting in/out of bed).  Transfers Overall transfer level: Independent Equipment used: None             General transfer comment: sit to/from stand steady without loss of balance  Ambulation/Gait Ambulation/Gait assistance: Independent Ambulation Distance (Feet): 110 Feet Assistive device: None Gait Pattern/deviations: WFL(Within Functional Limits)   Gait velocity interpretation: at or above normal speed for age/gender General Gait  Details: steady without loss of balance  Stairs            Wheelchair Mobility    Modified Rankin (Stroke Patients Only)       Balance Overall balance assessment: Independent                                           Pertinent Vitals/Pain Pain Assessment: No/denies pain  HR WFL during session.    Home Living Family/patient expects to be discharged to:: Private residence Living Arrangements: Children;Other relatives Available Help at Discharge: Family Type of Home: Apartment (1st level) Home Access: Level entry     Home Layout: One level Home Equipment: None      Prior Function Level of Independence: Independent               Hand Dominance        Extremity/Trunk Assessment   Upper Extremity Assessment Upper Extremity Assessment: Overall WFL for tasks assessed    Lower Extremity Assessment Lower Extremity Assessment: Overall WFL for tasks assessed    Cervical / Trunk Assessment Cervical / Trunk Assessment: Normal  Communication   Communication: No difficulties  Cognition Arousal/Alertness: Awake/alert Behavior During Therapy: WFL for tasks assessed/performed Overall Cognitive Status: Within Functional Limits for tasks assessed                      General Comments   Nursing cleared  pt for participation in physical therapy and to trial pt on room air.  Pt agreeable to PT session.     Exercises     Assessment/Plan    PT Assessment Patent does not need any further PT services  PT Problem List            PT Treatment Interventions      PT Goals (Current goals can be found in the Care Plan section)  Acute Rehab PT Goals Patient Stated Goal: to be able to breath better PT Goal Formulation: With patient Time For Goal Achievement: 02/25/16 Potential to Achieve Goals: Fair    Frequency     Barriers to discharge        Co-evaluation               End of Session Equipment Utilized During  Treatment: Gait belt;Oxygen (2 L O2 via nasal cannula) Activity Tolerance: Patient tolerated treatment well Patient left:  (sitting on edge of bed talking on phone) Nurse Communication: Mobility status;Precautions (O2 sats)         Time: 1130-1140 PT Time Calculation (min) (ACUTE ONLY): 10 min   Charges:   PT Evaluation $PT Eval Low Complexity: 1 Procedure     PT G CodesHendricks Limes 03-01-2016, 1:52 PM Hendricks Limes, PT [phone]

## 2016-02-11 NOTE — Progress Notes (Signed)
[facility] - [place] at [facility]   PATIENT NAME: Rhonda LoraMigdalia Wright    MR#:  [id number]  DATE OF BIRTH:  [birth year]  SUBJECTIVE:  CHIEF COMPLAINT: Patients short of breath and coughing are better  REVIEW OF SYSTEMS:  CONSTITUTIONAL: No fever, fatigue or weakness.  EYES: No blurred or double vision.  EARS, NOSE, AND THROAT: No tinnitus or ear pain.  RESPIRATORY:  Reports some cough and tightness in her chest improved  Exertional shortness of breath CARDIOVASCULAR: No chest pain, orthopnea, edema.  GASTROINTESTINAL: No nausea, vomiting, diarrhea or abdominal pain.  GENITOURINARY: No dysuria, hematuria.  ENDOCRINE: No polyuria, nocturia,  HEMATOLOGY: No anemia, easy bruising or bleeding SKIN: No rash or lesion. MUSCULOSKELETAL: No joint pain or arthritis.   NEUROLOGIC: No tingling, numbness, weakness.  PSYCHIATRY: No anxiety or depression.   DRUG ALLERGIES:   Allergies  Allergen Reactions  . Amoxicillin Swelling and Other (See Comments)    Has patient had a PCN reaction causing immediate rash, facial/tongue/throat swelling, SOB or lightheadedness with hypotension: Yes Has patient had a PCN reaction causing severe rash involving mucus membranes or skin necrosis: No Has patient had a PCN reaction that required hospitalization No Has patient had a PCN reaction occurring within the last 10 years: Yes If all of the above answers are "NO", then may proceed with Cephalosporin use.     VITALS:  Blood pressure (!) 160/91, pulse 75, temperature 97.8 F (36.6 C), temperature source Oral, resp. rate 20, height 5' (1.524 m), weight 136.1 kg (300 lb), SpO2 97 %.  PHYSICAL EXAMINATION:  GENERAL:  61 y.o.-year-old patient lying in the bed with no acute distress.  EYES: Pupils equal, round, reactive to light and accommodation. No scleral icterus. Extraocular muscles intact.  HEENT: Head atraumatic, normocephalic. Oropharynx and nasopharynx clear.  NECK:  Supple, no  jugular venous distention. No thyroid enlargement, no tenderness.  LUNGS: Diminished  breath sounds bilaterally, no wheezing, rales,rhonchi or crepitation. No use of accessory muscles of respiration.  CARDIOVASCULAR: S1, S2 normal. No murmurs, rubs, or gallops.  ABDOMEN: Soft, nontender, nondistended. Bowel sounds present. No organomegaly or mass.  EXTREMITIES: No pedal edema, cyanosis, or clubbing.  NEUROLOGIC: Cranial nerves II through XII are intact. Muscle strength 5/5 in all extremities. Sensation intact. Gait not checked.  PSYCHIATRIC: The patient is alert and oriented x 3.  SKIN: No obvious rash, lesion, or ulcer.    LABORATORY PANEL:   CBC  Recent Labs Lab 02/10/16 0555  WBC 8.7  HGB 13.7  HCT 39.3  PLT 152   ------------------------------------------------------------------------------------------------------------------  Chemistries   Recent Labs Lab 02/10/16 0555  NA 134*  K 4.2  CL 99*  CO2 27  GLUCOSE 324*  BUN 14  CREATININE 0.69  CALCIUM 8.5*   ------------------------------------------------------------------------------------------------------------------  Cardiac Enzymes  Recent Labs Lab 02/10/16 1138  TROPONINI 0.03*   ------------------------------------------------------------------------------------------------------------------  RADIOLOGY:  No results found.  EKG:   Orders placed or performed during the hospital encounter of 02/09/16  . ED EKG within 10 minutes  . ED EKG within 10 minutes    ASSESSMENT AND PLAN:   Acute Hypoxia secondary to asthma exacerbation and influenza Clinically doing okay. Continue oxygen and wean off as tolerated-    Influenza -  Continue Tamiflu. Nebulizer treatments and supportive treatment    Asthma exacerbation -  Patient is feeling better. Taper  her IV steroids, nebulizer treatments and symptomatic treatment Incentive spirometry     HTN (hypertension) - stable, continue home meds  Diabetes (HCC) - sliding scale insulin with corresponding glucose checks   Physical therapy- no PT needs  All the records are reviewed and case discussed with Care Management/Social Workerr. Management plans discussed with the patient, family and they are in agreement.  CODE STATUS: FC   TOTAL TIME TAKING CARE OF THIS PATIENT: 36 minutes.   POSSIBLE D/C IN 1-2 DAYS, DEPENDING ON CLINICAL CONDITION.  Note: This dictation was prepared with Dragon dictation along with smaller phrase technology. Any transcriptional errors that result from this process are unintentional.   Ramonita Lab M.D on 02/11/2016 at 10:14 PM  Between 7am to 6pm - Pager - [phone] After 6pm go to [email or web address] - password [email or web address] [facility]  [place] [facility]  Office  [phone]  CC: Primary care physician; No PCP Per Patient

## 2016-02-11 NOTE — Progress Notes (Addendum)
Inpatient Diabetes Program Recommendations  AACE/ADA: New Consensus Statement on Inpatient Glycemic Control (2015)  Target Ranges:  Prepandial:   less than 140 mg/dL      Peak postprandial:   less than 180 mg/dL (1-2 hours)      Critically ill patients:  140 - 180 mg/dL   Results for Rhonda Wright, Rhonda Wright (MRN [id number]) as of 02/11/2016 11:07  Ref. Range 02/10/2016 08:42 02/10/2016 12:16 02/10/2016 17:22 02/10/2016 20:20 02/11/2016 08:06  Glucose-Capillary Latest Ref Range: 65 - 99 mg/dL 409333 (H) 811256 (H) 914194 (H) 223 (H) 145 (H)  Results for Rhonda Wright, Rhonda Wright (MRN [id number]) as of 02/11/2016 11:07  Ref. Range 02/10/2016 05:55  Hemoglobin A1C Latest Ref Range: 4.8 - 5.6 % 9.9 (H)   Review of Glycemic Control  Diabetes history: DM2 Outpatient Diabetes medications: None Current orders for Inpatient glycemic control: Lantus 20 units QHS, Novolog 0-9 units TID with meals, Novolog 0-5 units QHS  Inpatient Diabetes Program Recommendations: HgbA1C: A1C 9.9% on 02/10/16 indicating an average glucose of 237 mg/dl over the past 2-3 months. Patient will need to be discharged on DM medication(s) for glycemic control.  Addendum 02/11/16@13 :38-Spoke with patient over phone about diabetes and home regimen for diabetes control. Patient reports that she moved to [place] 3 years ago and she has not seen a doctor since she moved here. Patient reports that she was taking Metformin 500 mg daily before she moved to [place].  Discussed A1C results (9.9% on 02/10/16) and explained that his current A1C indicates an average glucose of 237 mg/dl over the past 2-3 months. Discussed glucose and A1C goals. Discussed importance of checking CBGs and maintaining good CBG control to prevent long-term and short-term complications. Explained how hyperglycemia leads to damage within blood vessels which lead to the common complications seen with uncontrolled diabetes. Stressed to the patient the importance of improving glycemic control to prevent further  complications from uncontrolled diabetes. Discussed impact of nutrition, exercise, stress, sickness, and medications on diabetes control. Patient states that she tries to watch her diet but admits to drinking orange juice, 2% milk and lots of chocolate milk each day.  Discussed carbohydrates, carbohydrate goals per day and meal, along with portion sizes. Informed patient she will be getting Living Well with Diabetes booklet from RN and asked patient to read entire book once she receives it. Encouraged patient to check her glucose 3-4 times per day (before meals and at bedtime) and to keep a log book of glucose readings which dhe will need to take to doctor appointments. Explained how the doctor dhe follows up with can use the log book to continue to make insulin adjustments if needed. Patient will need to find a PCP in the area. Placed consult for CM to provide list of providers in the area accepting new patients. Also consulted RD for further diet education. Patient verbalized understanding of information discussed and she states that she has no further questions at this time related to diabetes.  At time of discharge, patient will need Rx for: glucometer, testing supplies, and any DM medications prescribed at time of discharge.  Thanks, Orlando PennerMarie Clayton Jarmon, RN, MSN, CDE Diabetes Coordinator Inpatient Diabetes Program [phone] (Team Pager)   Thanks, Orlando PennerMarie Leonilda Cozby, RN, MSN, CDE Diabetes Coordinator Inpatient Diabetes Program [phone] (Team Pager from 8am to 5pm)

## 2016-02-11 NOTE — Progress Notes (Signed)
  RD consulted for nutrition education regarding diabetes.   Lab Results  Component Value Date   HGBA1C 9.9 (H) 02/10/2016    RD provided "Carbohydrate Counting for People with Diabetes" handout from the Academy of Nutrition and Dietetics. Discussed different food groups and their effects on blood sugar, emphasizing carbohydrate-containing foods. Provided list of carbohydrates and recommended serving sizes of common foods.  Discussed importance of controlled and consistent carbohydrate intake throughout the day. Provided examples of ways to balance meals/snacks and encouraged intake of high-fiber, whole grain complex carbohydrates. Teach back method used.  Expect fair compliance.  Body mass index is 58.59 kg/m. Pt meets criteria for morbid obesity based on current BMI.  Current diet order is heart healthy/carbohydrate controlled, patient is consuming approximately 100% of meals at this time. Labs and medications reviewed. No further nutrition interventions warranted at this time. RD contact information provided. If additional nutrition issues arise, please re-consult RD.  Betsey Holidayasey Foxx Klarich, RD, LDN Pager #- [phone]

## 2016-02-11 NOTE — Care Management (Signed)
CSW was consulted for medication needs.  Patient listed with  [facility].  Attempted to speak with patient, however patient is in the shower at this time.

## 2016-02-12 LAB — GLUCOSE, CAPILLARY
Glucose-Capillary: 121 mg/dL — ABNORMAL HIGH (ref 65–99)
Glucose-Capillary: 263 mg/dL — ABNORMAL HIGH (ref 65–99)
Glucose-Capillary: 261 mg/dL — ABNORMAL HIGH (ref 65–99)

## 2016-02-12 MED ORDER — GUAIFENESIN-DM 100-10 MG/5ML PO SYRP
10.0000 mL | ORAL_SOLUTION | Freq: Four times a day (QID) | ORAL | 0 refills | Status: DC | PRN
Start: 2016-02-12 — End: 2016-06-15

## 2016-02-12 MED ORDER — ACETAMINOPHEN 325 MG PO TABS
650.0000 mg | ORAL_TABLET | Freq: Four times a day (QID) | ORAL | Status: AC | PRN
Start: 2016-02-12 — End: ?

## 2016-02-12 MED ORDER — OSELTAMIVIR PHOSPHATE 75 MG PO CAPS: 75.0000 mg | ORAL_CAPSULE | Freq: Two times a day (BID) | ORAL | 0 refills | Status: DC

## 2016-02-12 MED ORDER — PREDNISONE 10 MG (21) PO TBPK: 10.0000 mg | ORAL_TABLET | Freq: Every day | ORAL | 0 refills | Status: DC

## 2016-02-12 MED ORDER — OXYCODONE-ACETAMINOPHEN 5-325 MG PO TABS: 1.0000 | ORAL_TABLET | Freq: Four times a day (QID) | ORAL | 0 refills | Status: DC | PRN

## 2016-02-12 MED ORDER — AMLODIPINE BESYLATE 5 MG PO TABS: 5.0000 mg | ORAL_TABLET | Freq: Every day | ORAL | 0 refills | Status: DC

## 2016-02-12 MED ORDER — INSULIN ASPART PROT & ASPART (70-30 MIX) 100 UNIT/ML ~~LOC~~ SUSP: 15.0000 [IU] | Freq: Two times a day (BID) | SUBCUTANEOUS | 10 refills | Status: DC

## 2016-02-12 NOTE — Progress Notes (Signed)
Inpatient Diabetes Program Recommendations  AACE/ADA: New Consensus Statement on Inpatient Glycemic Control (2015)  Target Ranges:  Prepandial:   less than 140 mg/dL      Peak postprandial:   less than 180 mg/dL (1-2 hours)      Critically ill patients:  140 - 180 mg/dL   Lab Results  Component Value Date   GLUCAP 121 (H) 02/12/2016   HGBA1C 9.9 (H) 02/10/2016    Review of Glycemic Control  Results for Dingwall, Kyndell (MRN [id number]) as of 02/12/2016 11:22  Ref. Range 02/11/2016 17:26 02/11/2016 22:15 02/12/2016 07:38  Glucose-Capillary Latest Ref Range: 65 - 99 mg/dL 956341 (H) 213185 (H) 086121 (H)    Diabetes history: DM2 Outpatient Diabetes medications: None Current orders for Inpatient glycemic control: Lantus 20 units QHS, Novolog 0-9 units TID with meals, Novolog 0-5 units QHS  Fasting blood sugar ideal but post prandial blood sugars remain elevated.  Consider adding Novolog 3 units tid for meal coverage- continue Novolog correction as ordered.   At time of discharge, patient will need Rx for: glucometer, testing supplies, and any DM medications prescribed at time of discharge.   Susette RacerJulie Aurelius Gildersleeve, RN, BA, MHA, CDE Diabetes Coordinator Inpatient Diabetes Program  [phone] (Team Pager) [phone] [facility] Office) 02/12/2016 11:28 AM

## 2016-02-12 NOTE — Progress Notes (Signed)
Alert and oriented. Vital signs stable . No signs of acute distress. Discharge instructions given. Patient verbalized understanding. No other issues noted at this time.    

## 2016-02-12 NOTE — Discharge Summary (Addendum)
[facility] [place] [facility]   PATIENT NAME: Rhonda Wright    MR#:  [id number]  DATE OF BIRTH:  1955/07/23  DATE OF ADMISSION:  02/09/2016 ADMITTING PHYSICIAN: Oralia Manisavid Willis, MD  DATE OF DISCHARGE: 02/12/16 PRIMARY CARE PHYSICIAN: No PCP Per Patient    ADMISSION DIAGNOSIS:  Shortness of breath [R06.02] Influenza A [J10.1] Hypoxia [R09.02]  DISCHARGE DIAGNOSIS:  Acute hypoxic respiratory failure Influenza Asthma exacerbation Insulin-requiring diabetes mellitus  SECONDARY DIAGNOSIS:   Past Medical History:  Diagnosis Date  . Asthma   . Diabetes mellitus without complication (HCC)   . Hypertension     HOSPITAL COURSE:  Brief history and physical Rhonda Wright  is a 61 y.o. female who presents with Increased shortness of breath, malaise, fever for the past 2 days. Patient states she was diagnosed not very long ago with asthma, and has had difficulty breathing since that time. However over the past 2 days this grew significantly worse, and she developed fevers with increased cough and fatigue. Here in the ED she was found to have O2 sats in the mid 80s on room air, which corrected well with oxygen via nasal cannula. She was found to be fluid positive. Chest x-ray did not seem to indicate pneumonia and a right blood cell count was within normal limits, as well as the rest of her labs. Tamiflu was started. Given her hypoxia in the setting of confirm influenza, hospitalists were called for admission  Hospital course  Acute Hypoxia secondary to asthma exacerbation and influenza Clinically doing okay. Continued oxygen and weand off as tolerated to RA  Influenza -  Continue Tamiflu for a total of 5 days. Nebulizer treatments and supportive treatment provided  Asthma exacerbation -  Patient is feeling better. Taper  her IV steroids to prednisone by mouth he is, nebulizer treatments and symptomatic treatment Incentive spirometry   HTN  (hypertension) - stable, continue home meds  Diabetes (HCC) -  HbA1c- 9.9 sliding scale insulin with corresponding glucose checks and lantus given during the hospital course As patient cannot afford Lantus will change it to Novolog 70/30 15 units twice a day Prescription given for glucometer, test strips, lancets. Patient is to check Accu-Cheks 3 times a day   Physical therapy- no PT needs  DISCHARGE CONDITIONS:  Stable   CONSULTS OBTAINED:     PROCEDURES None  DRUG ALLERGIES:   Allergies  Allergen Reactions  . Amoxicillin Swelling and Other (See Comments)    Has patient had a PCN reaction causing immediate rash, facial/tongue/throat swelling, SOB or lightheadedness with hypotension: Yes Has patient had a PCN reaction causing severe rash involving mucus membranes or skin necrosis: No Has patient had a PCN reaction that required hospitalization No Has patient had a PCN reaction occurring within the last 10 years: Yes If all of the above answers are "NO", then may proceed with Cephalosporin use.     DISCHARGE MEDICATIONS:   Current Discharge Medication List    START taking these medications   Details  acetaminophen (TYLENOL) 325 MG tablet Take 2 tablets (650 mg total) by mouth every 6 (six) hours as needed for mild pain (or Fever >/= 101).    amLODipine (NORVASC) 5 MG tablet Take 1 tablet (5 mg total) by mouth daily. Qty: 30 tablet, Refills: 0    guaiFENesin-dextromethorphan (ROBITUSSIN DM) 100-10 MG/5ML syrup Take 10 mLs by mouth every 6 (six) hours as needed for cough. Qty: 118 mL, Refills: 0    insulin aspart protamine-  aspart (NOVOLOG MIX 70/30) (70-30) 100 UNIT/ML injection Inject 0.15 mLs (15 Units total) into the skin 2 (two) times daily with a meal. Qty: 10 mL, Refills: 10    oseltamivir (TAMIFLU) 75 MG capsule Take 1 capsule (75 mg total) by mouth 2 (two) times daily. Qty: 5 capsule, Refills: 0    oxyCODONE-acetaminophen (PERCOCET/ROXICET) 5-325 MG  tablet Take 1 tablet by mouth every 6 (six) hours as needed for moderate pain. Qty: 20 tablet, Refills: 0    predniSONE (STERAPRED UNI-PAK 21 TAB) 10 MG (21) TBPK tablet Take 1 tablet (10 mg total) by mouth daily. Take 6 tablets by mouth for 1 day followed by  5 tablets by mouth for 1 day followed by  4 tablets by mouth for 1 day followed by  3 tablets by mouth for 1 day followed by  2 tablets by mouth for 1 day followed by  1 tablet by mouth for a day and stop Qty: 21 tablet, Refills: 0      CONTINUE these medications which have NOT CHANGED   Details  albuterol (PROVENTIL HFA;VENTOLIN HFA) 108 (90 Base) MCG/ACT inhaler Inhale 2 puffs into the lungs every 6 (six) hours as needed for wheezing or shortness of breath. Qty: 1 Inhaler, Refills: 2    albuterol (PROVENTIL) (2.5 MG/3ML) 0.083% nebulizer solution Take 2.5 mg by nebulization every 6 (six) hours as needed for wheezing or shortness of breath.    ibuprofen (ADVIL,MOTRIN) 200 MG tablet Take 1,000-1,200 mg by mouth every 6 (six) hours as needed for fever or mild pain.         DISCHARGE INSTRUCTIONS:   Follow-up with primary care physician in [place] in a week/[facility] in a week Accu-Cheks Take prescriptions as recommended  DIET:  Cardiac diet, diabetic  DISCHARGE CONDITION:  Stable  ACTIVITY:  Activity as tolerated  OXYGEN:  Home Oxygen: No.   Oxygen Delivery: room air  DISCHARGE LOCATION:  home   If you experience worsening of your admission symptoms, develop shortness of breath, life threatening emergency, suicidal or homicidal thoughts you must seek medical attention immediately by calling 911 or calling your MD immediately  if symptoms less severe.  You Must read complete instructions/literature along with all the possible adverse reactions/side effects for all the Medicines you take and that have been prescribed to you. Take any new Medicines after you have completely understood and  accpet all the possible adverse reactions/side effects.   Please note  You were cared for by a hospitalist during your hospital stay. If you have any questions about your discharge medications or the care you received while you were in the hospital after you are discharged, you can call the unit and asked to speak with the hospitalist on call if the hospitalist that took care of you is not available. Once you are discharged, your primary care physician will handle any further medical issues. Please note that NO REFILLS for any discharge medications will be authorized once you are discharged, as it is imperative that you return to your primary care physician (or establish a relationship with a primary care physician if you do not have one) for your aftercare needs so that they can reassess your need for medications and monitor your lab values.     Today  Chief Complaint  Patient presents with  . Shortness of Breath  . Chest Pain   Pt is feeling much better. Denies any shortness of breath. She lives in [place] visiting daughter in  [place]  ROS:  CONSTITUTIONAL: Denies fevers, chills. Denies any fatigue, weakness.  EYES: Denies blurry vision, double vision, eye pain. EARS, NOSE, THROAT: Denies tinnitus, ear pain, hearing loss. RESPIRATORY: Denies cough, wheeze, shortness of breath.  CARDIOVASCULAR: Denies chest pain, palpitations, edema.  GASTROINTESTINAL: Denies nausea, vomiting, diarrhea, abdominal pain. Denies bright red blood per rectum. GENITOURINARY: Denies dysuria, hematuria. ENDOCRINE: Denies nocturia or thyroid problems. HEMATOLOGIC AND LYMPHATIC: Denies easy bruising or bleeding. SKIN: Denies rash or lesion. MUSCULOSKELETAL: Denies pain in neck, back, shoulder, knees, hips or arthritic symptoms.  NEUROLOGIC: Denies paralysis, paresthesias.  PSYCHIATRIC: Denies anxiety or depressive symptoms.   VITAL SIGNS:  Blood pressure (!) 167/94, pulse 83, temperature 98 F (36.7 C),  temperature source Oral, resp. rate 20, height 5' (1.524 m), weight 136.1 kg (300 lb), SpO2 92 %.  I/O:    Intake/Output Summary (Last 24 hours) at 02/12/16 1603 Last data filed at 02/12/16 0739  Gross per 24 hour  Intake                0 ml  Output             1650 ml  Net            -1650 ml    PHYSICAL EXAMINATION:  GENERAL:  61 y.o.-year-old patient lying in the bed with no acute distress.  EYES: Pupils equal, round, reactive to light and accommodation. No scleral icterus. Extraocular muscles intact.  HEENT: Head atraumatic, normocephalic. Oropharynx and nasopharynx clear.  NECK:  Supple, no jugular venous distention. No thyroid enlargement, no tenderness.  LUNGS: Normal breath sounds bilaterally, no wheezing, rales,rhonchi or crepitation. No use of accessory muscles of respiration.  CARDIOVASCULAR: S1, S2 normal. No murmurs, rubs, or gallops.  ABDOMEN: Soft, non-tender, non-distended. Bowel sounds present. No organomegaly or mass.  EXTREMITIES: No pedal edema, cyanosis, or clubbing.  NEUROLOGIC: Cranial nerves II through XII are intact. Muscle strength 5/5 in all extremities. Sensation intact. Gait not checked.  PSYCHIATRIC: The patient is alert and oriented x 3.  SKIN: No obvious rash, lesion, or ulcer.   DATA REVIEW:   CBC  Recent Labs Lab 02/10/16 0555  WBC 8.7  HGB 13.7  HCT 39.3  PLT 152    Chemistries   Recent Labs Lab 02/10/16 0555  NA 134*  K 4.2  CL 99*  CO2 27  GLUCOSE 324*  BUN 14  CREATININE 0.69  CALCIUM 8.5*    Cardiac Enzymes  Recent Labs Lab 02/10/16 1138  TROPONINI 0.03*    Microbiology Results  No results found for this or any previous visit.  RADIOLOGY:  Dg Chest 2 View  Result Date: 02/09/2016 CLINICAL DATA:  Shortness of breath, sternal chest pressure. History of diabetes and hypertension. EXAM: CHEST  2 VIEW COMPARISON:  Chest radiograph September 21, 2015 FINDINGS: Cardiac silhouette is mildly enlarged. Prominent  bronchovascular markings without pleural effusion. Bibasilar strandy densities. No pneumothorax. Soft tissue planes and included osseous structures are unchanged. Mild degenerative change of the thoracic spine. IMPRESSION: Mild cardiomegaly. Prominent bronchovascular markings can be seen with pulmonary edema and/or bronchitis/reactive airway disease. Bibasilar atelectasis. Electronically Signed   By: Awilda Metro M.D.   On: 02/09/2016 20:24    EKG:   Orders placed or performed during the hospital encounter of 02/09/16  . ED EKG within 10 minutes  . ED EKG within 10 minutes      Management plans discussed with the patient, family and they are in agreement.  CODE STATUS:  Code Status Orders        Start     Ordered   02/10/16 0002  Full code  Continuous     02/10/16 0001    Code Status History    Date Active Date Inactive Code Status Order ID Comments User Context   This patient has a current code status but no historical code status.      TOTAL TIME TAKING CARE OF THIS PATIENT: 45 minutes.   Note: This dictation was prepared with Dragon dictation along with smaller phrase technology. Any transcriptional errors that result from this process are unintentional.   @MEC @  on 02/12/2016 at 4:03 PM  Between 7am to 6pm - Pager - [phone]  After 6pm go to [email or web address] - password [email or web address] [facility]  [place] [facility]  Office  [phone]  CC: Primary care physician; No PCP Per Patient

## 2016-02-12 NOTE — Care Management (Signed)
Patient to be discharged home today. Patient was admitted with flu.  Patient also has a history of DM that has been untreated at home.  Patient states "I live off social security, and medical care is something that I do not factor into the budget".  Patient expressed to the bedside RN that she may not fill her insulin prescriptions pending on the price.  MD discharged patient on 70/30 per  diabetes coordinator to be more cost effective.  Script wrote for patient for glucometer and supplies.  RNCM recommended Relion Prime to be purchased from [employer] to be more cost effective.   Patient also states that she does not have PCP.  PCP offices were closed today due to inclimate weather, RNCM unable to set up new patient appointment.  Patient states that she is currently staying with her daughter in [place], however her mailing address is [place] "in order to keep [employer] insurance".  I have encouraged patient to set up PCP appointment for follow  Up care.  RNCM signing off

## 2016-06-13 ENCOUNTER — Emergency Department (HOSPITAL_COMMUNITY): Payer: Medicare HMO

## 2016-06-13 ENCOUNTER — Inpatient Hospital Stay (HOSPITAL_COMMUNITY)
Admission: EM | Admit: 2016-06-13 | Discharge: 2016-06-15 | DRG: 202 | Disposition: A | Discharge status: Home / Self Care | Payer: Medicare HMO | Attending: Internal Medicine | Admitting: Internal Medicine

## 2016-06-13 DIAGNOSIS — J441 Chronic obstructive pulmonary disease with (acute) exacerbation: Secondary | ICD-10-CM | POA: Diagnosis present

## 2016-06-13 DIAGNOSIS — J45901 Unspecified asthma with (acute) exacerbation: Secondary | ICD-10-CM | POA: Diagnosis not present

## 2016-06-13 DIAGNOSIS — E118 Type 2 diabetes mellitus with unspecified complications: Secondary | ICD-10-CM | POA: Diagnosis present

## 2016-06-13 DIAGNOSIS — Z6841 Body Mass Index (BMI) 40.0 and over, adult: Secondary | ICD-10-CM

## 2016-06-13 DIAGNOSIS — I160 Hypertensive urgency: Secondary | ICD-10-CM | POA: Diagnosis present

## 2016-06-13 DIAGNOSIS — J4551 Severe persistent asthma with (acute) exacerbation: Principal | ICD-10-CM | POA: Diagnosis present

## 2016-06-13 DIAGNOSIS — R740 Nonspecific elevation of levels of transaminase and lactic acid dehydrogenase [LDH]: Secondary | ICD-10-CM | POA: Diagnosis present

## 2016-06-13 DIAGNOSIS — Z87891 Personal history of nicotine dependence: Secondary | ICD-10-CM

## 2016-06-13 DIAGNOSIS — R0602 Shortness of breath: Secondary | ICD-10-CM | POA: Diagnosis not present

## 2016-06-13 DIAGNOSIS — I10 Essential (primary) hypertension: Secondary | ICD-10-CM | POA: Diagnosis present

## 2016-06-13 DIAGNOSIS — J449 Chronic obstructive pulmonary disease, unspecified: Secondary | ICD-10-CM | POA: Diagnosis present

## 2016-06-13 DIAGNOSIS — E1165 Type 2 diabetes mellitus with hyperglycemia: Secondary | ICD-10-CM | POA: Diagnosis present

## 2016-06-13 LAB — GLUCOSE, CAPILLARY: GLUCOSE-CAPILLARY: 425 mg/dL — AB (ref 65–99)

## 2016-06-13 LAB — CBC WITH DIFFERENTIAL/PLATELET
Basophils Absolute: 0 10*3/uL (ref 0.0–0.1)
Basophils Relative: 0 %
EOS ABS: 0.1 10*3/uL (ref 0.0–0.7)
EOS PCT: 1 %
HCT: 41.2 % (ref 36.0–46.0)
Hemoglobin: 13.6 g/dL (ref 12.0–15.0)
LYMPHS ABS: 0.8 10*3/uL (ref 0.7–4.0)
LYMPHS PCT: 9 %
MCH: 29.8 pg (ref 26.0–34.0)
MCHC: 33 g/dL (ref 30.0–36.0)
MCV: 90.2 fL (ref 78.0–100.0)
MONO ABS: 0.2 10*3/uL (ref 0.1–1.0)
Monocytes Relative: 2 %
Neutro Abs: 8.1 10*3/uL — ABNORMAL HIGH (ref 1.7–7.7)
Neutrophils Relative %: 88 %
PLATELETS: 194 10*3/uL (ref 150–400)
RBC: 4.57 MIL/uL (ref 3.87–5.11)
RDW: 13.3 % (ref 11.5–15.5)
WBC: 9.2 10*3/uL (ref 4.0–10.5)

## 2016-06-13 LAB — COMPREHENSIVE METABOLIC PANEL
ALT: 81 U/L — ABNORMAL HIGH (ref 14–54)
ANION GAP: 11 (ref 5–15)
AST: 46 U/L — ABNORMAL HIGH (ref 15–41)
Albumin: 3.7 g/dL (ref 3.5–5.0)
Alkaline Phosphatase: 93 U/L (ref 38–126)
BUN: 17 mg/dL (ref 6–20)
CHLORIDE: 102 mmol/L (ref 101–111)
CO2: 25 mmol/L (ref 22–32)
CREATININE: 0.66 mg/dL (ref 0.44–1.00)
Calcium: 9.1 mg/dL (ref 8.9–10.3)
Glucose, Bld: 249 mg/dL — ABNORMAL HIGH (ref 65–99)
Potassium: 3.6 mmol/L (ref 3.5–5.1)
SODIUM: 138 mmol/L (ref 135–145)
Total Bilirubin: 0.6 mg/dL (ref 0.3–1.2)
Total Protein: 7.7 g/dL (ref 6.5–8.1)

## 2016-06-13 MED ORDER — PREDNISOLONE 5 MG PO TABS: 40.0000 mg | ORAL_TABLET | Freq: Every day | ORAL | Status: DC

## 2016-06-13 MED ORDER — ALBUTEROL (5 MG/ML) CONTINUOUS INHALATION SOLN
10.0000 mg/h | INHALATION_SOLUTION | Freq: Once | RESPIRATORY_TRACT | Status: AC
  Administered 2016-06-13: 10 mg/h via RESPIRATORY_TRACT
  Filled 2016-06-13: qty 20

## 2016-06-13 MED ORDER — INSULIN GLARGINE 100 UNIT/ML ~~LOC~~ SOLN
20.0000 [IU] | Freq: Every day | SUBCUTANEOUS | Status: DC
  Administered 2016-06-14: 20 [IU] via SUBCUTANEOUS
  Filled 2016-06-13: qty 0.2

## 2016-06-13 MED ORDER — ALBUTEROL SULFATE (2.5 MG/3ML) 0.083% IN NEBU
5.0000 mg | INHALATION_SOLUTION | Freq: Once | RESPIRATORY_TRACT | Status: AC
  Administered 2016-06-13: 5 mg via RESPIRATORY_TRACT
  Filled 2016-06-13: qty 6

## 2016-06-13 MED ORDER — ALBUTEROL SULFATE (2.5 MG/3ML) 0.083% IN NEBU
INHALATION_SOLUTION | RESPIRATORY_TRACT | Status: AC
  Filled 2016-06-13: qty 6

## 2016-06-13 MED ORDER — INSULIN ASPART 100 UNIT/ML ~~LOC~~ SOLN
0.0000 [IU] | Freq: Three times a day (TID) | SUBCUTANEOUS | Status: DC
  Administered 2016-06-14 (×3): 8 [IU] via SUBCUTANEOUS
  Administered 2016-06-15: 5 [IU] via SUBCUTANEOUS

## 2016-06-13 MED ORDER — PREDNISONE 50 MG PO TABS
60.0000 mg | ORAL_TABLET | Freq: Every day | ORAL | Status: DC
  Administered 2016-06-13: 60 mg via ORAL
  Filled 2016-06-13: qty 3

## 2016-06-13 MED ORDER — AMLODIPINE BESYLATE 10 MG PO TABS
10.0000 mg | ORAL_TABLET | Freq: Every day | ORAL | Status: DC
  Administered 2016-06-14 – 2016-06-15 (×2): 10 mg via ORAL
  Filled 2016-06-13 (×2): qty 1

## 2016-06-13 MED ORDER — ENOXAPARIN SODIUM 40 MG/0.4ML ~~LOC~~ SOLN
40.0000 mg | SUBCUTANEOUS | Status: DC
  Administered 2016-06-14 – 2016-06-15 (×2): 40 mg via SUBCUTANEOUS
  Filled 2016-06-13 (×2): qty 0.4

## 2016-06-13 MED ORDER — ALBUTEROL SULFATE (2.5 MG/3ML) 0.083% IN NEBU
5.0000 mg | INHALATION_SOLUTION | Freq: Once | RESPIRATORY_TRACT | Status: AC
  Administered 2016-06-13: 5 mg via RESPIRATORY_TRACT

## 2016-06-13 MED ORDER — ACETAMINOPHEN 325 MG PO TABS
650.0000 mg | ORAL_TABLET | Freq: Once | ORAL | Status: AC
  Administered 2016-06-13: 650 mg via ORAL
  Filled 2016-06-13: qty 2

## 2016-06-13 MED ORDER — PREDNISONE 20 MG PO TABS
40.0000 mg | ORAL_TABLET | Freq: Every day | ORAL | Status: DC
  Administered 2016-06-14: 40 mg via ORAL
  Filled 2016-06-13: qty 2

## 2016-06-13 MED ORDER — INSULIN ASPART 100 UNIT/ML ~~LOC~~ SOLN
0.0000 [IU] | Freq: Every day | SUBCUTANEOUS | Status: DC
  Administered 2016-06-14: 4 [IU] via SUBCUTANEOUS
  Administered 2016-06-14: 5 [IU] via SUBCUTANEOUS

## 2016-06-13 MED ORDER — ACETAMINOPHEN 325 MG PO TABS
650.0000 mg | ORAL_TABLET | Freq: Four times a day (QID) | ORAL | Status: DC | PRN
  Administered 2016-06-14 – 2016-06-15 (×2): 650 mg via ORAL
  Filled 2016-06-13 (×2): qty 2

## 2016-06-13 MED ORDER — METHYLPREDNISOLONE SODIUM SUCC 125 MG IJ SOLR
125.0000 mg | Freq: Once | INTRAMUSCULAR | Status: AC
  Administered 2016-06-13: 125 mg via INTRAVENOUS
  Filled 2016-06-13: qty 2

## 2016-06-13 MED ORDER — LISINOPRIL 20 MG PO TABS
20.0000 mg | ORAL_TABLET | Freq: Every day | ORAL | Status: DC
  Administered 2016-06-14 – 2016-06-15 (×2): 20 mg via ORAL
  Filled 2016-06-13 (×2): qty 1

## 2016-06-13 MED ORDER — IPRATROPIUM-ALBUTEROL 0.5-2.5 (3) MG/3ML IN SOLN
3.0000 mL | RESPIRATORY_TRACT | Status: DC
  Administered 2016-06-13: 3 mL via RESPIRATORY_TRACT
  Filled 2016-06-13: qty 3

## 2016-06-13 MED ORDER — MAGNESIUM SULFATE 2 GM/50ML IV SOLN
2.0000 g | Freq: Once | INTRAVENOUS | Status: AC
  Administered 2016-06-13: 2 g via INTRAVENOUS
  Filled 2016-06-13: qty 50

## 2016-06-13 NOTE — ED Notes (Signed)
Attempted to call report

## 2016-06-13 NOTE — ED Provider Notes (Signed)
[facility]-EMERGENCY DEPT Provider Note   CSN: [id number] Arrival date & time: 06/13/16  1504     History   Chief Complaint Chief Complaint  Patient presents with  . Asthma  . Shortness of Breath    HPI Rhonda Wright is a 61 y.o. female.  The history is provided by the patient. No language interpreter was used.  Asthma  This is a new problem. The current episode started more than 1 week ago. The problem occurs constantly. The problem has not changed since onset.Associated symptoms include shortness of breath. Nothing aggravates the symptoms. Nothing relieves the symptoms. She has tried nothing for the symptoms. The treatment provided moderate relief.  Shortness of Breath  Associated medical issues include asthma.  Pt complains of asthma attack.  Pt reports she had a similar episode in the past and was diagnosed with the flu.  Pt has been using breathing treatments at home with no relief  Past Medical History:  Diagnosis Date  . Asthma   . Diabetes mellitus without complication (HCC)   . Hypertension     Patient Active Problem List   Diagnosis Date Noted  . Hypoxia 02/09/2016  . Influenza 02/09/2016  . HTN (hypertension) 02/09/2016  . Diabetes (HCC) 02/09/2016  . Asthma exacerbation 02/09/2016    Past Surgical History:  Procedure Laterality Date  . NO PAST SURGERIES      OB History    No data available       Home Medications    Prior to Admission medications   Medication Sig Start Date End Date Taking? Authorizing Provider  albuterol (PROVENTIL HFA;VENTOLIN HFA) 108 (90 Base) MCG/ACT inhaler Inhale 2 puffs into the lungs every 6 (six) hours as needed for wheezing or shortness of breath. 11/21/15  Yes Willy Eddy, MD  ibuprofen (ADVIL,MOTRIN) 200 MG tablet Take 1,000-1,200 mg by mouth every 6 (six) hours as needed for fever or mild pain.   Yes [provider]  acetaminophen (TYLENOL) 325 MG tablet Take 2 tablets (650 mg total) by mouth every 6 (six)  hours as needed for mild pain (or Fever >/= 101). Patient not taking: Reported on 06/13/2016 02/12/16   Ramonita Lab, MD  amLODipine (NORVASC) 5 MG tablet Take 1 tablet (5 mg total) by mouth daily. Patient not taking: Reported on 06/13/2016 02/13/16   Ramonita Lab, MD  guaiFENesin-dextromethorphan (ROBITUSSIN DM) 100-10 MG/5ML syrup Take 10 mLs by mouth every 6 (six) hours as needed for cough. Patient not taking: Reported on 06/13/2016 02/12/16   Ramonita Lab, MD  insulin aspart protamine- aspart (NOVOLOG MIX 70/30) (70-30) 100 UNIT/ML injection Inject 0.15 mLs (15 Units total) into the skin 2 (two) times daily with a meal. Patient not taking: Reported on 06/13/2016 02/12/16   Ramonita Lab, MD  oseltamivir (TAMIFLU) 75 MG capsule Take 1 capsule (75 mg total) by mouth 2 (two) times daily. Patient not taking: Reported on 06/13/2016 02/12/16   Ramonita Lab, MD  oxyCODONE-acetaminophen (PERCOCET/ROXICET) 5-325 MG tablet Take 1 tablet by mouth every 6 (six) hours as needed for moderate pain. Patient not taking: Reported on 06/13/2016 02/12/16   Ramonita Lab, MD  predniSONE (STERAPRED UNI-PAK 21 TAB) 10 MG (21) TBPK tablet Take 1 tablet (10 mg total) by mouth daily. Take 6 tablets by mouth for 1 day followed by  5 tablets by mouth for 1 day followed by  4 tablets by mouth for 1 day followed by  3 tablets by mouth for 1 day followed by  2 tablets by mouth  for 1 day followed by  1 tablet by mouth for a day and stop Patient not taking: Reported on 06/13/2016 02/12/16   Ramonita LabGouru, Aruna, MD    Family History Family History  Problem Relation Age of Onset  . Diabetes Other   . Hypertension Other   . Cancer Other     Social History Social History  Substance Use Topics  . Smoking status: Never Smoker  . Smokeless tobacco: Never Used  . Alcohol use No     Allergies   Amoxicillin   Review of Systems Review of Systems  Respiratory: Positive for shortness of breath.   All other systems reviewed and are  negative.    Physical Exam Updated Vital Signs BP (!) 120/91   Pulse 73   Temp 99.3 F (37.4 C)   Resp (!) 28   SpO2 91%   Physical Exam  Constitutional: She appears well-developed and well-nourished. No distress.  HENT:  Head: Normocephalic and atraumatic.  Eyes: Conjunctivae are normal.  Neck: Neck supple.  Cardiovascular: Normal rate and regular rhythm.   No murmur heard. Pulmonary/Chest: Effort normal. No respiratory distress. She has wheezes.  Abdominal: Soft. There is no tenderness.  Musculoskeletal: She exhibits no edema.  Neurological: She is alert.  Skin: Skin is warm and dry.  Psychiatric: She has a normal mood and affect.  Nursing note and vitals reviewed.    ED Treatments / Results  Labs (all labs ordered are listed, but only abnormal results are displayed) Labs Reviewed  CBC WITH DIFFERENTIAL/PLATELET - Abnormal; Notable for the following:       Result Value   Neutro Abs 8.1 (*)    All other components within normal limits  COMPREHENSIVE METABOLIC PANEL - Abnormal; Notable for the following:    Glucose, Bld 249 (*)    AST 46 (*)    ALT 81 (*)    All other components within normal limits    EKG  EKG Interpretation None       Radiology Dg Chest 2 View  Result Date: 06/13/2016 CLINICAL DATA:  Wheezing, shortness of breath, history of asthma EXAM: CHEST  2 VIEW COMPARISON:  02/09/2016 FINDINGS: Lungs are essentially clear. No focal consolidation No pleural effusion or pneumothorax. The heart is normal in size. Degenerative changes of the visualized thoracolumbar spine. IMPRESSION: No evidence of acute cardiopulmonary disease. Electronically Signed   By: Charline BillsSriyesh  Krishnan M.D.   On: 06/13/2016 15:37    Procedures Procedures (including critical care time)  Medications Ordered in ED Medications  albuterol (PROVENTIL) (2.5 MG/3ML) 0.083% nebulizer solution (not administered)  predniSONE (DELTASONE) tablet 60 mg (60 mg Oral Given 06/13/16 1619)    albuterol (PROVENTIL) (2.5 MG/3ML) 0.083% nebulizer solution 5 mg (5 mg Nebulization Given 06/13/16 1515)  albuterol (PROVENTIL,VENTOLIN) solution continuous neb (10 mg/hr Nebulization Given 06/13/16 1627)  methylPREDNISolone sodium succinate (SOLU-MEDROL) 125 mg/2 mL injection 125 mg (125 mg Intravenous Given 06/13/16 2023)  acetaminophen (TYLENOL) tablet 650 mg (650 mg Oral Given 06/13/16 2026)     Initial Impression / Assessment and Plan / ED Course  I have reviewed the triage vital signs and the nursing notes.  Pertinent labs & imaging results that were available during my care of the patient were reviewed by me and considered in my medical decision making (see chart for details).     Pt given duonb then 1 hour continous neb with relief only during treatments.  Pt given Iv solumedrol.   Hospitalist consulted for admission  Final Clinical  Impressions(s) / ED Diagnoses   Final diagnoses:  Severe persistent asthma with exacerbation    New Prescriptions New Prescriptions   No medications on file     Osie Cheeks 06/13/16 2153    Arby Barrette, MD 06/13/16 2204

## 2016-06-13 NOTE — ED Notes (Signed)
ED Provider at bedside. 

## 2016-06-13 NOTE — ED Notes (Signed)
Nurse currently starting IV and will draw labs. 

## 2016-06-13 NOTE — ED Triage Notes (Signed)
Pt complaining of asthma and wheezing. Pt states hx of same. Pt states ongoing x 1 week, no relief with inhaler. Pt sats 95% on RA. Wheezing noted throughout.

## 2016-06-13 NOTE — ED Notes (Signed)
RT at bedside to admin breathing tx

## 2016-06-13 NOTE — ED Notes (Signed)
Pt came back to room from xray.

## 2016-06-13 NOTE — H&P (Signed)
History and Physical   Rhonda Wright Born RUE:[id number] DOB: 10-28-1955 DOA: 06/13/2016  PCP: Patient, No Pcp Per  Chief Complaint: Wheezing  HPI:  Rhonda Wright is a 61 year old female past medical history significant for diabetes mellitus type 2, essential hypertension, asthma. Patient presented to emergency department on 5/20 for shortness breath and wheezing. Symptoms have been present for the preceding 1 week. No alleviating or exacerbating factors. Patient has been using her grandsons albuterol inhaler and nebulizer machine approximately every 4 hours with minimal relief. No known sick contacts. No fever or chills at home. Patient has a cough that is nonproductive in nature. Due to limited finances, patient does not take any medications at home except for occasional ibuprofen and her grandson's inhaler. She has been homeless in the past, but currently resides with her daughters.  ED Course:  Patient was hemodynamically stable in the emergency department. No fever, heart rate 72, initial blood pressure 198/105. Patient was wheezing on exam and placed on continuous albuterol inhaler. This was moderately effective in relieving her shortness of breath, wheezing return after the breathing treatment. She was given Solu-Medrol. Patient was saturating approximately 91% on room air. Chest x-ray negative for pneumonia.  Review of Systems: A complete ROS was obtained; pertinent positives negatives are denoted in the HPI. Otherwise, all systems are negative.   Past Medical History:  Diagnosis Date  . Asthma   . Diabetes mellitus without complication (HCC)   . Hypertension    Social History   Social History  . Marital status: Single    Spouse name: N/A  . Number of children: N/A  . Years of education: N/A   Occupational History  . Not on file.   Social History Main Topics  . Smoking status: Never Smoker  . Smokeless tobacco: Never Used  . Alcohol use No  . Drug use: No  . Sexual activity: Not  on file   Other Topics Concern  . Not on file   Social History Narrative  . No narrative on file   Family History  Problem Relation Age of Onset  . Diabetes Other   . Hypertension Other   . Cancer Other     Physical Exam: Vitals:   06/13/16 2100 06/13/16 2115 06/13/16 2130 06/13/16 2145  BP: (!) 120/91 (!) 154/103 (!) 176/89 (!) 163/98  Pulse: 73 72 68 72  Resp: (!) 28 (!) 23 19 17   Temp:      SpO2: 91% (!) 89% (!) 89% 92%   General: Appears calm and comfortable. ENT: Grossly normal hearing, MMM. Cardiovascular: RRR.  Respiratory: Expiratory wheezes on auscultation. No accessory muscle use. Able to speak in full sentences. Abdomen: Soft, non-tender. Bowel sounds present.  Skin: No rash or induration seen on limited exam. Musculoskeletal: Grossly normal tone BUE/BLE. Appropriate ROM.  Psychiatric: Grossly normal mood and affect. Neurologic: Moves all extremities in coordinated fashion.  I have personally reviewed the following labs, culture data, and imaging studies.  Labs:  Labs notable for leukocytosis 9.2, hemoglobin 13.6, platelet count 194. BUNs 17, creatinine 0.66. Hyperglycemic at 249. AST and ALT mildly elevated at 46 and 81 respectively  Radiology:  CXR (5/20) -WNL  Assessment/Plan: Rhonda Wright is a 61 year old female past medical history significant for diabetes mellitus type 2, essential hypertension, asthma. Patient presented to emergency department on 5/20 for shortness breath and wheezing.  #1 Acute asthma exacerbation This is patient's second hospitalization for asthma exacerbation. She likely has moderately persistent asthma at baseline. She currently takes albuterol on  an intermittent basis. Doubt underlying infection such as pneumonia. It is possible patient has an upper respiratory infection. She is still wheezing on exam, but is nontoxic-appearing and able to speak in full sentences. Plan is to admit to inpatient for every 4 scheduled breathing  treatments. Will check RVP and start prednisone 40 mg daily tomorrow morning. Patient has already received Solu-Medrol in the emergency department. Patient would benefit from ICS and asthma action plan at time of discharge.  #2 Essential hypertension Patient presented with hypertensive urgency to emergent department. No signs or symptoms of end organ damage. She has been instructed to take amlodipine in the past, but is not currently doing so. Plan is to gradually lower her blood pressure over the next 2-3 days. Will resume amlodipine and also start low-dose ACE inhibitor, as patient has a compelling indication for ACE inhibitor given concomitant diabetes mellitus type 2.  #3 Diabetes mellitus type 2 Overall, patient has suboptimal glycemic control with last A1c of 9.9%. She currently does not take metformin or insulin at home. She is hyperglycemic and will continue to have high glucose values in the setting of steroid use. Will start Lantus 20 units nightly and also sliding scale insulin. Would benefit from metformin at time of discharge.  #4 Mild transaminitis  AST 46, ALT 81. Unclear etiology of mild elevation. We'll check hepatitis panel and trend LFTs. We'll also check INR to assess synthetic function of liver. If LFTs failed to improve, would benefit from right upper quadrant ultrasound.  DVT prophylaxis: Subq Lovenox Code Status: Full Code Disposition Plan: Anticipate D/C home in 2-5 days Consults called: N/A Admission status: Inpatient  Tyrone SageMatthew Kamiah Fite, MD [facility] Page:[phone]  If 7PM-7AM, please contact night-coverage [email or web address] Password [email or web address]

## 2016-06-14 ENCOUNTER — Encounter (HOSPITAL_COMMUNITY): Payer: Self-pay

## 2016-06-14 DIAGNOSIS — E118 Type 2 diabetes mellitus with unspecified complications: Secondary | ICD-10-CM

## 2016-06-14 DIAGNOSIS — J4551 Severe persistent asthma with (acute) exacerbation: Principal | ICD-10-CM

## 2016-06-14 DIAGNOSIS — J45901 Unspecified asthma with (acute) exacerbation: Secondary | ICD-10-CM

## 2016-06-14 LAB — COMPREHENSIVE METABOLIC PANEL
ALK PHOS: 94 U/L (ref 38–126)
ALT: 80 U/L — AB (ref 14–54)
AST: 43 U/L — AB (ref 15–41)
Albumin: 3.7 g/dL (ref 3.5–5.0)
Anion gap: 10 (ref 5–15)
BUN: 19 mg/dL (ref 6–20)
CALCIUM: 9.3 mg/dL (ref 8.9–10.3)
CO2: 25 mmol/L (ref 22–32)
CREATININE: 0.83 mg/dL (ref 0.44–1.00)
Chloride: 101 mmol/L (ref 101–111)
Glucose, Bld: 425 mg/dL — ABNORMAL HIGH (ref 65–99)
Potassium: 3.7 mmol/L (ref 3.5–5.1)
Sodium: 136 mmol/L (ref 135–145)
Total Bilirubin: 0.5 mg/dL (ref 0.3–1.2)
Total Protein: 7.6 g/dL (ref 6.5–8.1)

## 2016-06-14 LAB — GLUCOSE, CAPILLARY
GLUCOSE-CAPILLARY: 358 mg/dL — AB (ref 65–99)
GLUCOSE-CAPILLARY: 272 mg/dL — AB (ref 65–99)
Glucose-Capillary: 437 mg/dL — ABNORMAL HIGH (ref 65–99)
Glucose-Capillary: 253 mg/dL — ABNORMAL HIGH (ref 65–99)
Glucose-Capillary: 269 mg/dL — ABNORMAL HIGH (ref 65–99)
Glucose-Capillary: 304 mg/dL — ABNORMAL HIGH (ref 65–99)

## 2016-06-14 LAB — CBC
HCT: 40.8 % (ref 36.0–46.0)
Hemoglobin: 13.7 g/dL (ref 12.0–15.0)
MCH: 30.1 pg (ref 26.0–34.0)
MCHC: 33.6 g/dL (ref 30.0–36.0)
MCV: 89.7 fL (ref 78.0–100.0)
PLATELETS: 189 10*3/uL (ref 150–400)
RBC: 4.55 MIL/uL (ref 3.87–5.11)
RDW: 13.2 % (ref 11.5–15.5)
WBC: 9.5 10*3/uL (ref 4.0–10.5)

## 2016-06-14 LAB — HIV ANTIBODY (ROUTINE TESTING W REFLEX): HIV Screen 4th Generation wRfx: NONREACTIVE

## 2016-06-14 LAB — PROTIME-INR
INR: 1.02
PROTHROMBIN TIME: 13.4 s (ref 11.4–15.2)

## 2016-06-14 MED ORDER — INSULIN ASPART 100 UNIT/ML ~~LOC~~ SOLN
5.0000 [IU] | Freq: Three times a day (TID) | SUBCUTANEOUS | Status: DC
  Administered 2016-06-14 – 2016-06-15 (×3): 5 [IU] via SUBCUTANEOUS

## 2016-06-14 MED ORDER — ALBUTEROL SULFATE (2.5 MG/3ML) 0.083% IN NEBU: 2.5000 mg | INHALATION_SOLUTION | RESPIRATORY_TRACT | Status: DC | PRN

## 2016-06-14 MED ORDER — INSULIN GLARGINE 100 UNIT/ML ~~LOC~~ SOLN
25.0000 [IU] | Freq: Two times a day (BID) | SUBCUTANEOUS | Status: DC
  Administered 2016-06-14 – 2016-06-15 (×3): 25 [IU] via SUBCUTANEOUS
  Filled 2016-06-14 (×3): qty 0.25

## 2016-06-14 MED ORDER — INSULIN ASPART 100 UNIT/ML ~~LOC~~ SOLN
15.0000 [IU] | Freq: Once | SUBCUTANEOUS | Status: AC
  Administered 2016-06-14: 15 [IU] via SUBCUTANEOUS

## 2016-06-14 MED ORDER — IPRATROPIUM-ALBUTEROL 0.5-2.5 (3) MG/3ML IN SOLN
3.0000 mL | Freq: Four times a day (QID) | RESPIRATORY_TRACT | Status: DC
  Administered 2016-06-14: 3 mL via RESPIRATORY_TRACT
  Filled 2016-06-14: qty 3

## 2016-06-14 MED ORDER — METHYLPREDNISOLONE SODIUM SUCC 125 MG IJ SOLR
60.0000 mg | Freq: Two times a day (BID) | INTRAMUSCULAR | Status: DC
  Administered 2016-06-14 – 2016-06-15 (×2): 60 mg via INTRAVENOUS
  Filled 2016-06-14 (×2): qty 2

## 2016-06-14 MED ORDER — IPRATROPIUM-ALBUTEROL 0.5-2.5 (3) MG/3ML IN SOLN
3.0000 mL | Freq: Three times a day (TID) | RESPIRATORY_TRACT | Status: DC
Start: 2016-06-14 — End: 2016-06-15
  Administered 2016-06-14 – 2016-06-15 (×3): 3 mL via RESPIRATORY_TRACT
  Filled 2016-06-14 (×3): qty 3

## 2016-06-14 MED ORDER — HYDRALAZINE HCL 20 MG/ML IJ SOLN
10.0000 mg | Freq: Four times a day (QID) | INTRAMUSCULAR | Status: DC | PRN
  Administered 2016-06-14 (×2): 10 mg via INTRAVENOUS
  Filled 2016-06-14 (×2): qty 1

## 2016-06-14 NOTE — Care Management Note (Signed)
Case Management Note  Patient Details  Name: Hardie LoraMigdalia Finnell MRN: [id number] Date of Birth: 1955/12/30  Subjective/Objective:                    Action/Plan:  Consult for medication assistance . Confirmed with patient she has [employer] MEDICARE . [place]. She called SS and was told she needs to change [employer] Medicare to [place].   Case Manager unable to do same. Patient has insurance card with medicare number on it . Explained to patient she has to call to have informtion changed . Patient voiced understanding.   Expected Discharge Date:  06/15/16               Expected Discharge Plan:  Home/Self Care  In-House Referral:     Discharge planning Services  CM Consult, Medication Assistance  Post Acute Care Choice:    Choice offered to:  Patient  DME Arranged:    DME Agency:     HH Arranged:    HH Agency:     Status of Service:  In process, will continue to follow  If discussed at Long Length of Stay Meetings, dates discussed:    Additional Comments:  Kingsley PlanWile, Ariez Neilan Marie, RN 06/14/2016, 9:47 AM

## 2016-06-14 NOTE — Progress Notes (Signed)
Pt's systolic BP>180 and BG=425. Dr. Toniann FailKakrakandy notified and ordered PRN hydralazine and 5 units of Novolog to be administer. MD verbalized to recheck BG in 4 hours and call him if BG remained high.

## 2016-06-14 NOTE — Progress Notes (Signed)
PROGRESS NOTE    Rhonda Wright  ZOX:[id number] DOB: Apr 22, 1955 DOA: 06/13/2016 PCP: Patient, No Pcp Per  Brief Narrative:Rhonda Wright is a 61 year old female past medical history significant for diabetes mellitus type 2-uncontrolled, unable to afford meds, essential hypertension, asthma/COPD, former smoker, presented to emergency department on 5/20 for shortness breath and wheezing   Assessment & Plan:   Principal Problem:   Asthma/COPD exacerbation -former smoker -improving, CXR clear -continue IV solumedrol, nebs, -FU resp virus panel    Type 2 diabetes mellitus with complication, without long-term current use of insulin (HCC) -needs PCP, not on meds, last Hba1c in 1/18 was 9.9, repeat pending -now on lantus, increase dose and add novolog meal coverage -will need Insulin 70/30 at discharge, DM coordinator consult -cost remains a concern -CM consult-needs med assistance and PCP    HTN -started Lisinopril    Obesity  DVT prophylaxis:lovenox Code Status: Full Code Family Communication: None at bedside Disposition Plan: Home tomorrow if better   Subjective: Breathing improving, not back to baseline yet  Objective: Vitals:   06/13/16 2245 06/13/16 2337 06/14/16 0100 06/14/16 0403  BP: (!) 115/99 (!) 181/85 (!) 161/86 (!) 160/75  Pulse: 82 73 75 65  Resp: (!) 25   19  Temp:  98.9 F (37.2 C)  98.1 F (36.7 C)  TempSrc:  Oral  Oral  SpO2: 96% 96%  96%  Weight:  109.3 kg (241 lb)    Height:  5' (1.524 m)      Intake/Output Summary (Last 24 hours) at 06/14/16 0909 Last data filed at 06/14/16 0400  Gross per 24 hour  Intake              360 ml  Output                0 ml  Net              360 ml   Filed Weights   06/13/16 2337  Weight: 109.3 kg (241 lb)    Examination:  General exam: Appears calm and comfortable  Respiratory system: poor air movement Cardiovascular system: S1 & S2 heard, RRR. No JVD, murmurs, rubs,  Gastrointestinal system: Abdomen is  nondistended, soft and nontender.Normal bowel sounds heard. Central nervous system: Alert and oriented. No focal neurological deficits. Extremities: Symmetric 5 x 5 power. Skin: No rashes, lesions or ulcers Psychiatry: Judgement and insight appear normal. Mood & affect appropriate.     Data Reviewed:   CBC:  Recent Labs Lab 06/13/16 2021 06/14/16 0005  WBC 9.2 9.5  NEUTROABS 8.1*  --   HGB 13.6 13.7  HCT 41.2 40.8  MCV 90.2 89.7  PLT 194 189   Basic Metabolic Panel:  Recent Labs Lab 06/13/16 2021 06/14/16 0005  NA 138 136  K 3.6 3.7  CL 102 101  CO2 25 25  GLUCOSE 249* 425*  BUN 17 19  CREATININE 0.66 0.83  CALCIUM 9.1 9.3   GFR: Estimated Creatinine Clearance: 77.8 mL/min (by C-G formula based on SCr of 0.83 mg/dL). Liver Function Tests:  Recent Labs Lab 06/13/16 2021 06/14/16 0005  AST 46* 43*  ALT 81* 80*  ALKPHOS 93 94  BILITOT 0.6 0.5  PROT 7.7 7.6  ALBUMIN 3.7 3.7   No results for input(s): LIPASE, AMYLASE in the last 168 hours. No results for input(s): AMMONIA in the last 168 hours. Coagulation Profile:  Recent Labs Lab 06/14/16 0005  INR 1.02   Cardiac Enzymes: No results for input(s): CKTOTAL, CKMB, CKMBINDEX,  TROPONINI in the last 168 hours. BNP (last 3 results) No results for input(s): PROBNP in the last 8760 hours. HbA1C: No results for input(s): HGBA1C in the last 72 hours. CBG:  Recent Labs Lab 06/13/16 2345 06/14/16 0143 06/14/16 0353 06/14/16 0817  GLUCAP 425* 437* 358* 253*   Lipid Profile: No results for input(s): CHOL, HDL, LDLCALC, TRIG, CHOLHDL, LDLDIRECT in the last 72 hours. Thyroid Function Tests: No results for input(s): TSH, T4TOTAL, FREET4, T3FREE, THYROIDAB in the last 72 hours. Anemia Panel: No results for input(s): VITAMINB12, FOLATE, FERRITIN, TIBC, IRON, RETICCTPCT in the last 72 hours. Urine analysis: No results found for: COLORURINE, APPEARANCEUR, LABSPEC, PHURINE, GLUCOSEU, HGBUR, BILIRUBINUR,  KETONESUR, PROTEINUR, UROBILINOGEN, NITRITE, LEUKOCYTESUR Sepsis Labs: @LABRCNTIP (procalcitonin:4,lacticidven:4)  )No results found for this or any previous visit (from the past 240 hour(s)).       Radiology Studies: Dg Chest 2 View  Result Date: 06/13/2016 CLINICAL DATA:  Wheezing, shortness of breath, history of asthma EXAM: CHEST  2 VIEW COMPARISON:  02/09/2016 FINDINGS: Lungs are essentially clear. No focal consolidation No pleural effusion or pneumothorax. The heart is normal in size. Degenerative changes of the visualized thoracolumbar spine. IMPRESSION: No evidence of acute cardiopulmonary disease. Electronically Signed   By: Charline BillsSriyesh  Krishnan M.D.   On: 06/13/2016 15:37        Scheduled Meds: . amLODipine  10 mg Oral Daily  . enoxaparin (LOVENOX) injection  40 mg Subcutaneous Q24H  . insulin aspart  0-15 Units Subcutaneous TID WC  . insulin aspart  0-5 Units Subcutaneous QHS  . insulin aspart  5 Units Subcutaneous TID WC  . insulin glargine  25 Units Subcutaneous BID  . ipratropium-albuterol  3 mL Nebulization Q6H  . lisinopril  20 mg Oral Daily  . methylPREDNISolone (SOLU-MEDROL) injection  60 mg Intravenous Q12H   Continuous Infusions:   LOS: 1 day    Time spent: 35min    Zannie CovePreetha Takirah Binford, MD [facility] Pager [phone]  If 7PM-7AM, please contact night-coverage [email or web address] Password [email or web address] 06/14/2016, 9:09 AM

## 2016-06-14 NOTE — Progress Notes (Signed)
BG=358, Dr. Toniann FailKakrakandy notified and is satisfied that BG is going down. Will continue to monitor and treat accordingly to sliding scale.

## 2016-06-14 NOTE — Progress Notes (Signed)
Inpatient Diabetes Program Recommendations  AACE/ADA: New Consensus Statement on Inpatient Glycemic Control (2015)  Target Ranges:  Prepandial:   less than 140 mg/dL      Peak postprandial:   less than 180 mg/dL (1-2 hours)      Critically ill patients:  140 - 180 mg/dL   Lab Results  Component Value Date   GLUCAP 269 (H) 06/14/2016   HGBA1C 9.9 (H) 02/10/2016    Review of Glycemic Control  Diabetes history: DM2 Outpatient Diabetes medications: 70/30 15 units bid (not taking) Current orders for Inpatient glycemic control: Lantus units bid, Novolog 0-15 units tidwc +5 units tidwc  Inpatient Diabetes Program Recommendations:    Need updated HgbA1C - last one 02/10/2016 When transitioning to 70/30 insulin, recommend:  70/30 20 units bid Prescription for Novolin R s/s 0-15 units tidwc and hs Will need meter and supplies prescription at discharge. Needs PCP to manage DM - Care management for assistance. RNs to allow pt to stick finger for glucose monitoring and give their own insulin.  Continue to follow.  Thank you. Rhonda Wright, RD, LDN, CDE Inpatient Diabetes Coordinator [phone]

## 2016-06-14 NOTE — Progress Notes (Signed)
Pt arrived to unit via stretcher escorted by Yvonna AlanisKaitlyn, Charity fundraiserN. Report had been previously gotten from SinaiJessica, Charity fundraiserN via phone. Pt arrived with 1 intact PIV, 2L of O2 via [place], and 2g of Mg running at 5750mL/hr. Pt ambulated from stretcher to hospital bed with no assistance and with a strong, steady gait. Pt complained of a 2/10 tooth ache and congestion in her chest but had no other complaints. Pt verbalizes understanding of all safety protocols. Will continue to monitor pt.

## 2016-06-15 DIAGNOSIS — J449 Chronic obstructive pulmonary disease, unspecified: Secondary | ICD-10-CM | POA: Diagnosis present

## 2016-06-15 LAB — CBC
HCT: 40.1 % (ref 36.0–46.0)
Hemoglobin: 13.5 g/dL (ref 12.0–15.0)
MCH: 30.3 pg (ref 26.0–34.0)
MCHC: 33.7 g/dL (ref 30.0–36.0)
MCV: 89.9 fL (ref 78.0–100.0)
PLATELETS: 212 10*3/uL (ref 150–400)
RBC: 4.46 MIL/uL (ref 3.87–5.11)
RDW: 13.4 % (ref 11.5–15.5)
WBC: 16.9 10*3/uL — ABNORMAL HIGH (ref 4.0–10.5)

## 2016-06-15 LAB — BASIC METABOLIC PANEL
Anion gap: 11 (ref 5–15)
BUN: 22 mg/dL — AB (ref 6–20)
CALCIUM: 9.4 mg/dL (ref 8.9–10.3)
CO2: 25 mmol/L (ref 22–32)
CREATININE: 0.67 mg/dL (ref 0.44–1.00)
Chloride: 103 mmol/L (ref 101–111)
GFR calc non Af Amer: >60 mL/min (ref >60)
GLUCOSE: 214 mg/dL — AB (ref 65–99)
Potassium: 4.3 mmol/L (ref 3.5–5.1)
Sodium: 139 mmol/L (ref 135–145)

## 2016-06-15 LAB — HEPATITIS PANEL, ACUTE
HEP A IGM: NEGATIVE
HEP B S AG: NEGATIVE
Hep B C IgM: NEGATIVE

## 2016-06-15 LAB — HEMOGLOBIN A1C
HEMOGLOBIN A1C: 8.2 % — AB (ref 4.8–5.6)
Mean Plasma Glucose: 189 mg/dL

## 2016-06-15 LAB — GLUCOSE, CAPILLARY: GLUCOSE-CAPILLARY: 210 mg/dL — AB (ref 65–99)

## 2016-06-15 MED ORDER — AZITHROMYCIN 250 MG PO TABS: 250.0000 mg | ORAL_TABLET | Freq: Every day | ORAL | 0 refills | Status: AC

## 2016-06-15 MED ORDER — BLOOD GLUCOSE METER KIT: PACK | 0 refills | Status: AC

## 2016-06-15 MED ORDER — AMLODIPINE BESYLATE 10 MG PO TABS: 10.0000 mg | ORAL_TABLET | Freq: Every day | ORAL | 0 refills | Status: AC

## 2016-06-15 MED ORDER — IPRATROPIUM-ALBUTEROL 0.5-2.5 (3) MG/3ML IN SOLN: 3.0000 mL | Freq: Two times a day (BID) | RESPIRATORY_TRACT | Status: DC

## 2016-06-15 MED ORDER — ALBUTEROL SULFATE HFA 108 (90 BASE) MCG/ACT IN AERS: 2.0000 | INHALATION_SPRAY | RESPIRATORY_TRACT | 2 refills | Status: AC | PRN

## 2016-06-15 MED ORDER — PREDNISONE 20 MG PO TABS: ORAL_TABLET | ORAL | 0 refills | Status: AC

## 2016-06-15 MED ORDER — INSULIN ASPART PROT & ASPART (70-30 MIX) 100 UNIT/ML ~~LOC~~ SUSP
20.0000 [IU] | Freq: Two times a day (BID) | SUBCUTANEOUS | 2 refills | Status: AC
Start: 2016-06-15 — End: ?

## 2016-06-15 NOTE — Discharge Summary (Addendum)
Physician Discharge Summary  Loretta Buick MVE:[id number] DOB: 03/09/1955 DOA: 06/13/2016  PCP: Patient, No Pcp Per  Admit date: 06/13/2016 Discharge date: 06/15/2016  Time spent:  1mnutes  Recommendations for Outpatient Follow-up:  1. [facility] 6/4 at 4pm-Needs Medication assistance  Discharge Diagnoses:  Principal Problem:   COPD vs Asthma exacerbation Active Problems:   HTN (hypertension)   Type 2 diabetes mellitus with complication, without long-term current use of insulin (HCC)   Former smoker   Severe Obesity  Discharge Condition: improved  Diet recommendation:diabetic  Filed Weights   06/13/16 2337  Weight: 109.3 kg (241 lb)    History of present illness:  Rhonda Wright is a 61year old female past medical history significant for diabetes mellitus type 2-uncontrolled, unable to afford meds, essential hypertension, asthma/COPD, former smoker, presented to emergency department on 5/20 for shortness breath and wheezing  Hospital Course:  Asthma/COPD exacerbation -former long term smoker, no h/o childhood asthma, suspect this is COPD,  -admitted with wheezing, shortness of breath and cough -improved with steroids, Abx, nebs, CXR clear -discharged home on prednisone taper, Abx -weaned off O2    Type 2 diabetes mellitus with complication, without long[place]term [place] use of [place] -needs PCP, not on meds, last Hba1c in 1/18 was 9.9 -was discharged home on Insulin in January but didn't fill this due to cost -CBGs in 300s this admission, started on lantus and transitioned to Insulin 70/30 at discharge due to COst -we [facility] or [facility] -given script for glucometer/supplies etc    HTN -resumed amlodipine, change to ACE at FU, depending on cost of meds at FU    Severe Obesity -BMI 47, needs to lose weight to better control DM and HTN   Consultations:  DM coordinator  Discharge Exam: Vitals:   06/14/16 2130 06/15/16  0514  BP: (!) 158/82 (!) 152/82  Pulse: 76 72  Resp:    Temp:  97.5 F (36.4 C)    General: AAOx3 Cardiovascular: S1S2/RRR Respiratory: improved air movement  Discharge Instructions   Discharge Instructions    Diet - low sodium heart healthy    Complete by:  As directed    Diet Carb Modified    Complete by:  As directed    Increase activity slowly    Complete by:  As directed      Discharge Medication List as of 06/15/2016 11:02 AM    START taking these medications   Details  azithromycin (ZITHROMAX) 250 MG tablet Take 1 tablet (250 mg total) by mouth daily. For 3days, Starting Tue 06/15/2016, Print    blood glucose meter kit and supplies Dispense based on patient and insurance preference. Use up to four times daily as directed. (FOR ICD-9 250.00, 250.01)., Print    predniSONE (DELTASONE) 20 MG tablet Take 440mfor 2days then 2021mor 3days then STOP, Print      CONTINUE these medications which have CHANGED   Details  albuterol (PROVENTIL HFA;VENTOLIN HFA) 108 (90 Base) MCG/ACT inhaler Inhale 2 puffs into the lungs every 4 (four) hours as needed for wheezing or shortness of breath., Starting Tue 06/15/2016, Print    amLODipine (NORVASC) 10 MG tablet Take 1 tablet (10 mg total) by mouth daily., Starting Tue 06/15/2016, Print    insulin aspart protamine- aspart (NOVOLOG MIX 70/30) (70-30) 100 UNIT/ML injection Inject 0.2 mLs (20 Units total) into the skin 2 (two) times daily with a meal., Starting Tue 06/15/2016, Print  CONTINUE these medications which have NOT CHANGED   Details  acetaminophen (TYLENOL) 325 MG tablet Take 2 tablets (650 mg total) by mouth every 6 (six) hours as needed for mild pain (or Fever >/= 101)., Starting Thu 02/12/2016, OTC      STOP taking these medications     guaiFENesin-dextromethorphan (ROBITUSSIN DM) 100-10 MG/5ML syrup      ibuprofen (ADVIL,MOTRIN) 200 MG tablet      oseltamivir (TAMIFLU) 75 MG capsule      oxyCODONE-acetaminophen  (PERCOCET/ROXICET) 5-325 MG tablet      predniSONE (STERAPRED UNI-PAK 21 TAB) 10 MG (21) TBPK tablet        Allergies  Allergen Reactions  . Amoxicillin Swelling and Other (See Comments)    Has patient had a PCN reaction causing immediate rash, facial/tongue/throat swelling, SOB or lightheadedness with hypotension: Yes Has patient had a PCN reaction causing severe rash involving mucus membranes or skin necrosis: No Has patient had a PCN reaction that required hospitalization No Has patient had a PCN reaction occurring within the last 10 years: Yes If all of the above answers are "NO", then may proceed with Cephalosporin use.    Follow-up [facility] up.   Why:  Monday June 28, 2016 at 4 pm , hospital follow up appointment  Contact information: [address] [place] [address] [phone]           The results of significant diagnostics from this hospitalization (including imaging, microbiology, ancillary and laboratory) are listed below for reference.    Significant Diagnostic Studies: Dg Chest 2 View  Result Date: 06/13/2016 CLINICAL DATA:  Wheezing, shortness of breath, history of asthma EXAM: CHEST  2 VIEW COMPARISON:  02/09/2016 FINDINGS: Lungs are essentially clear. No focal consolidation No pleural effusion or pneumothorax. The heart is normal in size. Degenerative changes of the visualized thoracolumbar spine. IMPRESSION: No evidence of acute cardiopulmonary disease. Electronically Signed   By: Julian Hy M.D.   On: 06/13/2016 15:37    Microbiology: No results found for this or any previous visit (from the past 240 hour(s)).   Labs: Basic Metabolic Panel:  Recent Labs Lab 06/13/16 2021 06/14/16 0005 06/15/16 0548  NA 138 136 139  K 3.6 3.7 4.3  CL 102 101 103  CO2 25 25 25   GLUCOSE 249* 425* 214*  BUN 17 19 22*  CREATININE 0.66 0.83 0.67  CALCIUM 9.1 9.3 9.4   Liver  Function Tests:  Recent Labs Lab 06/13/16 2021 06/14/16 0005  AST 46* 43*  ALT 81* 80*  ALKPHOS 93 94  BILITOT 0.6 0.5  PROT 7.7 7.6  ALBUMIN 3.7 3.7   No results for input(s): LIPASE, AMYLASE in the last 168 hours. No results for input(s): AMMONIA in the last 168 hours. CBC:  Recent Labs Lab 06/13/16 2021 06/14/16 0005 06/15/16 0548  WBC 9.2 9.5 16.9*  NEUTROABS 8.1*  --   --   HGB 13.6 13.7 13.5  HCT 41.2 40.8 40.1  MCV 90.2 89.7 89.9  PLT 194 189 212   Cardiac Enzymes: No results for input(s): CKTOTAL, CKMB, CKMBINDEX, TROPONINI in the last 168 hours. BNP: BNP (last 3 results)  Recent Labs  11/21/15 1026  BNP 91.0    ProBNP (last 3 results) No results for input(s): PROBNP in the last 8760 hours.  CBG:  Recent Labs Lab 06/14/16 0817 06/14/16 1219 06/14/16 1723 06/14/16 2118 06/15/16 0740  GLUCAP 253* 269* 272* 304*  210*       SignedDomenic Polite MD.  [facility] 06/15/2016, 11:46 AM

## 2016-06-15 NOTE — Progress Notes (Signed)
Patient sitting on side of bed watching TV.  C/O head pain 6/10.  Pain med given.  No signs or symptoms of distress.

## 2016-06-15 NOTE — Discharge Planning (Signed)
Patient discharged home in stable condition. Verbalizes understanding of all discharge instructions, including home medications and follow up appointments. Pt verbalizes understanding of drawing up insulin, appropriate subcutaneous injection sites and return demonstrated insulin injection.

## 2016-06-15 NOTE — Care Management Note (Signed)
Case Management Note  Patient Details  Name: Rhonda Wright MRN: [id number] Date of Birth: [birth year]  Subjective/Objective:                    Action/Plan: [facility], [facility] at 4 pm. Patient can go there today for assistance on filling prescriptions. Patient has [facility] contact information .   Patient voiced understanding to all of the above.  Expected Discharge Date:  06/15/16               Expected Discharge Plan:  Home/Self Care  In-House Referral:     Discharge planning Services  CM Consult, Medication Assistance, Indigent Health Clinic  Post Acute Care Choice:    Choice offered to:  Patient  DME Arranged:    DME Agency:     HH Arranged:    HH Agency:     Status of Service:  Completed, signed off  If discussed at [employer] Length of [employer] Meetings, dates discussed:    Additional Comments:  Kingsley PlanWile, Sierria Bruney Marie, RN 06/15/2016, 10:04 AM

## 2016-06-15 NOTE — Progress Notes (Signed)
Patient discharged home with no complaints.  Discharge instructions, and Rxs reviewed.  Patient verbalized understanding.

## 2016-06-28 ENCOUNTER — Inpatient Hospital Stay: Payer: Medicare HMO | Admitting: Internal Medicine

## 2019-08-03 DIAGNOSIS — J4520 Mild intermittent asthma, uncomplicated: Secondary | ICD-10-CM | POA: Diagnosis not present

## 2019-08-03 DIAGNOSIS — Z7984 Long term (current) use of oral hypoglycemic drugs: Secondary | ICD-10-CM | POA: Diagnosis not present

## 2019-08-03 DIAGNOSIS — E1169 Type 2 diabetes mellitus with other specified complication: Secondary | ICD-10-CM | POA: Diagnosis not present

## 2019-08-03 DIAGNOSIS — I10 Essential (primary) hypertension: Secondary | ICD-10-CM | POA: Diagnosis not present

## 2019-08-03 DIAGNOSIS — E785 Hyperlipidemia, unspecified: Secondary | ICD-10-CM | POA: Diagnosis not present

## 2019-08-27 ENCOUNTER — Ambulatory Visit: Payer: Medicare HMO | Admitting: Diagnostic Neuroimaging

## 2019-09-03 ENCOUNTER — Ambulatory Visit: Payer: Medicare HMO | Admitting: Diagnostic Neuroimaging

## 2019-09-14 ENCOUNTER — Other Ambulatory Visit: Payer: Self-pay | Admitting: Internal Medicine

## 2019-09-14 ENCOUNTER — Ambulatory Visit
Admission: RE | Admit: 2019-09-14 | Discharge: 2019-09-14 | Disposition: A | Discharge status: Home / Self Care | Payer: Medicare HMO | Source: Ambulatory Visit | Attending: Internal Medicine | Admitting: Internal Medicine

## 2019-09-14 DIAGNOSIS — M1611 Unilateral primary osteoarthritis, right hip: Secondary | ICD-10-CM | POA: Diagnosis not present

## 2019-09-14 DIAGNOSIS — E785 Hyperlipidemia, unspecified: Secondary | ICD-10-CM | POA: Diagnosis not present

## 2019-09-14 DIAGNOSIS — M79651 Pain in right thigh: Secondary | ICD-10-CM

## 2019-09-14 DIAGNOSIS — M25551 Pain in right hip: Secondary | ICD-10-CM | POA: Diagnosis not present

## 2019-09-14 DIAGNOSIS — E1169 Type 2 diabetes mellitus with other specified complication: Secondary | ICD-10-CM | POA: Diagnosis not present

## 2019-12-27 DIAGNOSIS — I10 Essential (primary) hypertension: Secondary | ICD-10-CM | POA: Diagnosis not present

## 2019-12-27 DIAGNOSIS — E1169 Type 2 diabetes mellitus with other specified complication: Secondary | ICD-10-CM | POA: Diagnosis not present

## 2019-12-27 DIAGNOSIS — Z7984 Long term (current) use of oral hypoglycemic drugs: Secondary | ICD-10-CM | POA: Diagnosis not present

## 2019-12-27 DIAGNOSIS — Z23 Encounter for immunization: Secondary | ICD-10-CM | POA: Diagnosis not present

## 2019-12-27 DIAGNOSIS — J4520 Mild intermittent asthma, uncomplicated: Secondary | ICD-10-CM | POA: Diagnosis not present

## 2020-02-04 DIAGNOSIS — U071 COVID-19: Secondary | ICD-10-CM | POA: Diagnosis not present

## 2020-02-04 DIAGNOSIS — Z20822 Contact with and (suspected) exposure to covid-19: Secondary | ICD-10-CM | POA: Diagnosis not present

## 2020-02-14 DIAGNOSIS — J029 Acute pharyngitis, unspecified: Secondary | ICD-10-CM | POA: Diagnosis not present

## 2020-02-14 DIAGNOSIS — Z8616 Personal history of COVID-19: Secondary | ICD-10-CM | POA: Diagnosis not present

## 2020-02-14 DIAGNOSIS — R059 Cough, unspecified: Secondary | ICD-10-CM | POA: Diagnosis not present

## 2020-09-09 DIAGNOSIS — I110 Hypertensive heart disease with heart failure: Secondary | ICD-10-CM | POA: Diagnosis not present

## 2020-09-09 DIAGNOSIS — M159 Polyosteoarthritis, unspecified: Secondary | ICD-10-CM | POA: Diagnosis not present

## 2020-09-09 DIAGNOSIS — I509 Heart failure, unspecified: Secondary | ICD-10-CM | POA: Diagnosis not present

## 2020-09-09 DIAGNOSIS — E1169 Type 2 diabetes mellitus with other specified complication: Secondary | ICD-10-CM | POA: Diagnosis not present

## 2020-09-09 DIAGNOSIS — J4520 Mild intermittent asthma, uncomplicated: Secondary | ICD-10-CM | POA: Diagnosis not present

## 2020-09-09 DIAGNOSIS — Z0000 Encounter for general adult medical examination without abnormal findings: Secondary | ICD-10-CM | POA: Diagnosis not present

## 2020-09-09 DIAGNOSIS — Z1211 Encounter for screening for malignant neoplasm of colon: Secondary | ICD-10-CM | POA: Diagnosis not present

## 2020-09-09 DIAGNOSIS — Z1231 Encounter for screening mammogram for malignant neoplasm of breast: Secondary | ICD-10-CM | POA: Diagnosis not present

## 2020-09-09 DIAGNOSIS — I10 Essential (primary) hypertension: Secondary | ICD-10-CM | POA: Diagnosis not present

## 2020-09-09 DIAGNOSIS — Z7984 Long term (current) use of oral hypoglycemic drugs: Secondary | ICD-10-CM | POA: Diagnosis not present

## 2020-10-02 DIAGNOSIS — F17211 Nicotine dependence, cigarettes, in remission: Secondary | ICD-10-CM | POA: Diagnosis not present

## 2020-10-02 DIAGNOSIS — Z029 Encounter for administrative examinations, unspecified: Secondary | ICD-10-CM | POA: Diagnosis not present

## 2020-10-02 DIAGNOSIS — R079 Chest pain, unspecified: Secondary | ICD-10-CM | POA: Diagnosis not present

## 2020-10-02 DIAGNOSIS — Z79899 Other long term (current) drug therapy: Secondary | ICD-10-CM | POA: Diagnosis not present

## 2020-10-02 DIAGNOSIS — J45909 Unspecified asthma, uncomplicated: Secondary | ICD-10-CM | POA: Diagnosis not present

## 2020-10-02 DIAGNOSIS — E1169 Type 2 diabetes mellitus with other specified complication: Secondary | ICD-10-CM | POA: Diagnosis not present

## 2020-10-02 DIAGNOSIS — G4730 Sleep apnea, unspecified: Secondary | ICD-10-CM | POA: Diagnosis not present

## 2020-10-02 DIAGNOSIS — E785 Hyperlipidemia, unspecified: Secondary | ICD-10-CM | POA: Diagnosis not present

## 2020-10-24 DIAGNOSIS — Z0001 Encounter for general adult medical examination with abnormal findings: Secondary | ICD-10-CM | POA: Diagnosis not present

## 2020-10-24 DIAGNOSIS — J45909 Unspecified asthma, uncomplicated: Secondary | ICD-10-CM | POA: Diagnosis not present

## 2020-10-24 DIAGNOSIS — E1140 Type 2 diabetes mellitus with diabetic neuropathy, unspecified: Secondary | ICD-10-CM | POA: Diagnosis not present

## 2020-10-24 DIAGNOSIS — Z23 Encounter for immunization: Secondary | ICD-10-CM | POA: Diagnosis not present

## 2020-10-24 DIAGNOSIS — M129 Arthropathy, unspecified: Secondary | ICD-10-CM | POA: Diagnosis not present

## 2020-10-24 DIAGNOSIS — F17211 Nicotine dependence, cigarettes, in remission: Secondary | ICD-10-CM | POA: Diagnosis not present

## 2020-10-24 DIAGNOSIS — E785 Hyperlipidemia, unspecified: Secondary | ICD-10-CM | POA: Diagnosis not present

## 2020-10-24 DIAGNOSIS — G4730 Sleep apnea, unspecified: Secondary | ICD-10-CM | POA: Diagnosis not present

## 2020-10-24 DIAGNOSIS — I10 Essential (primary) hypertension: Secondary | ICD-10-CM | POA: Diagnosis not present

## 2020-10-30 DIAGNOSIS — H109 Unspecified conjunctivitis: Secondary | ICD-10-CM | POA: Diagnosis not present

## 2020-10-30 DIAGNOSIS — J069 Acute upper respiratory infection, unspecified: Secondary | ICD-10-CM | POA: Diagnosis not present

## 2021-02-04 ENCOUNTER — Other Ambulatory Visit: Payer: Self-pay | Admitting: General Surgery

## 2021-02-04 ENCOUNTER — Other Ambulatory Visit: Payer: Self-pay

## 2021-02-04 ENCOUNTER — Other Ambulatory Visit: Payer: Self-pay | Admitting: Family Medicine

## 2021-02-04 ENCOUNTER — Ambulatory Visit
Admission: RE | Admit: 2021-02-04 | Discharge: 2021-02-04 | Disposition: A | Discharge status: Home / Self Care | Payer: Medicare HMO | Source: Ambulatory Visit | Attending: Family Medicine | Admitting: Family Medicine

## 2021-02-04 DIAGNOSIS — M25562 Pain in left knee: Secondary | ICD-10-CM

## 2021-02-24 ENCOUNTER — Other Ambulatory Visit: Payer: Self-pay | Admitting: Family Medicine

## 2023-09-06 IMAGING — CR DG KNEE 1-2V*L*
2 series · 2 of 2 positions shown · non-contrast
Comparison: None.

CLINICAL DATA: Pain

EXAM:
LEFT KNEE - 1-2 VIEW

[w knee ap left]
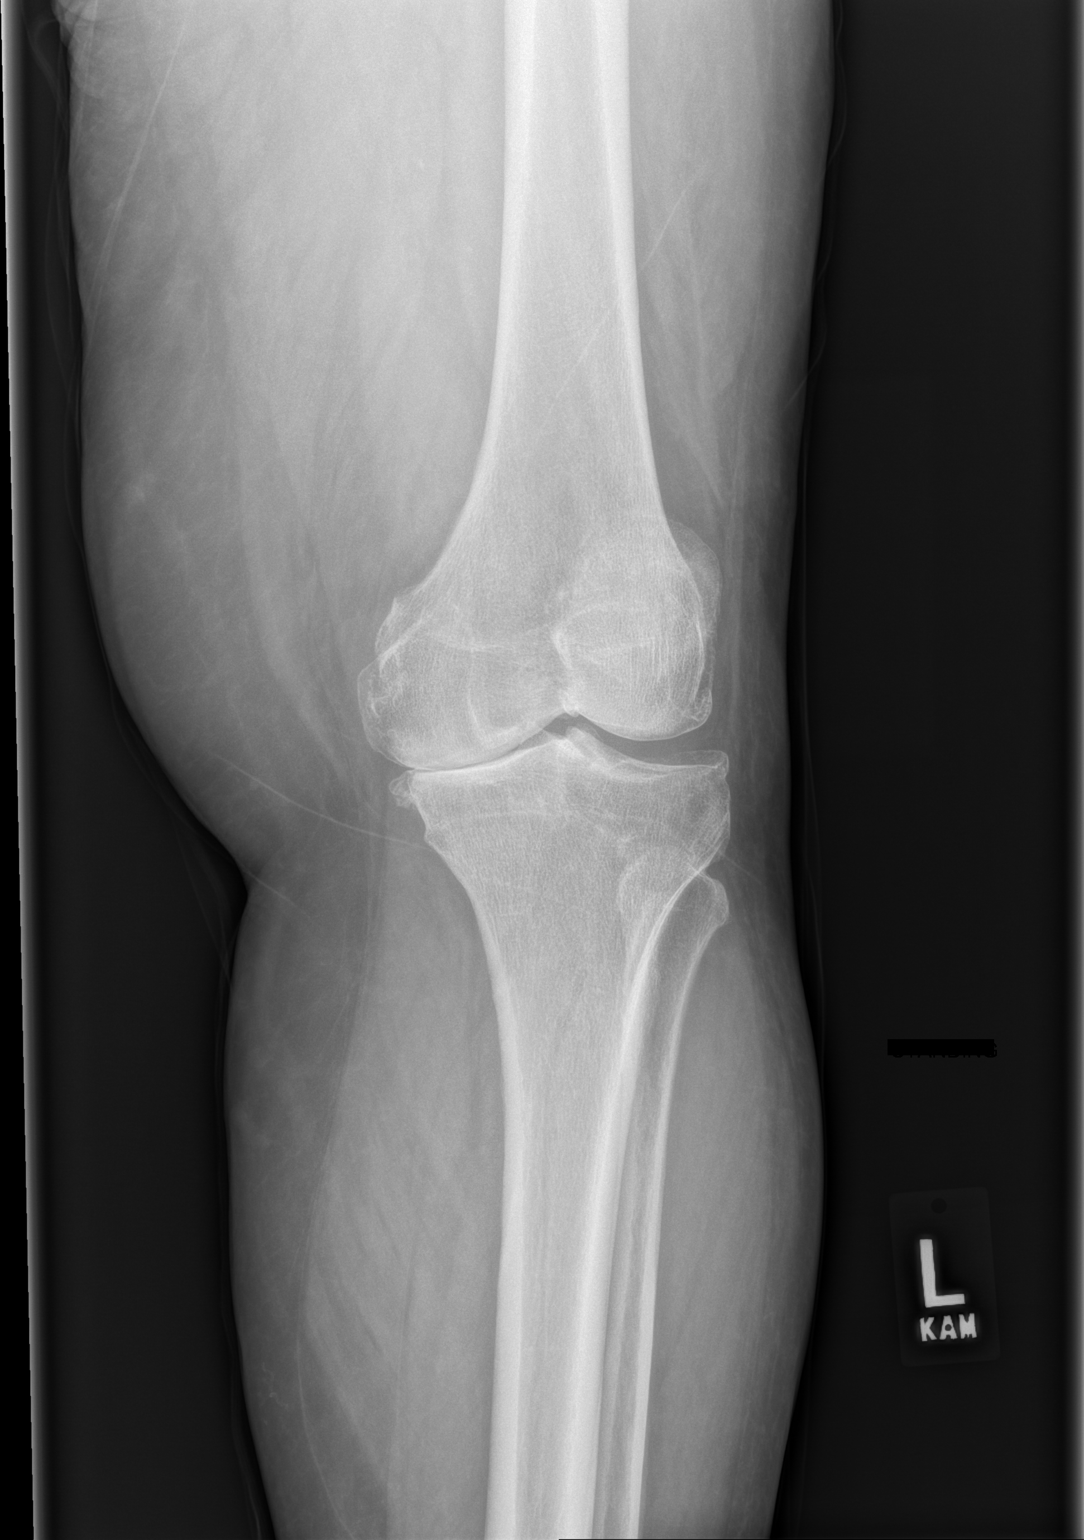

[w knee lat left]
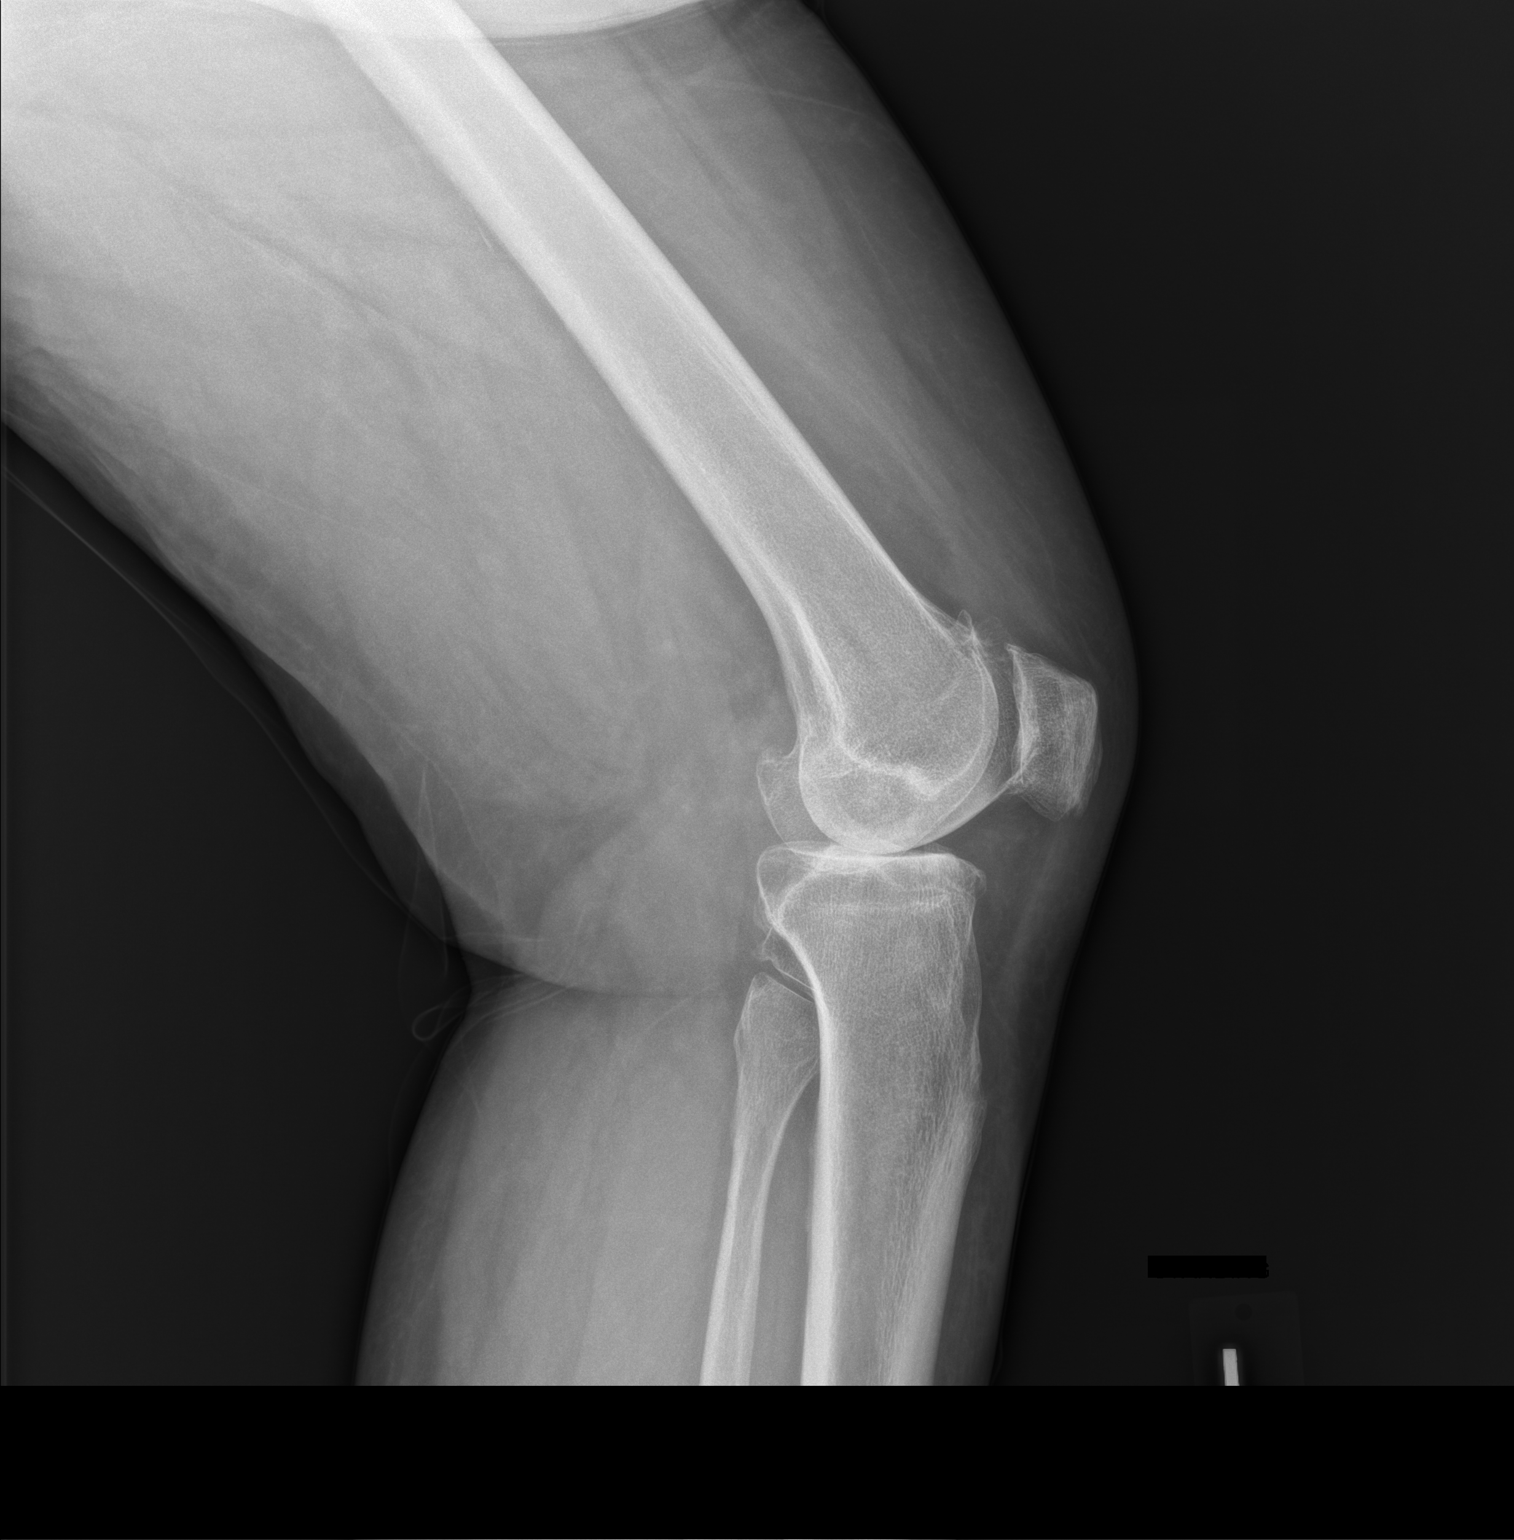

[2 of 2 positions shown; findings below may reference images not displayed]

FINDINGS: No recent fracture or dislocation is seen. There is possible minimal
effusion in the suprapatellar bursa. Degenerative changes are noted
with bony spurs in medial, lateral and patellofemoral compartments
more severe in the medial compartment. There is marked narrowing of
joint space in the medial compartment.
IMPRESSION: No recent fracture or dislocation is seen. Degenerative changes are
noted, more severe in the medial compartment. Possible small
effusion is present in the suprapatellar bursa.
